# Patient Record
Sex: Male | Born: 1980 | Race: Asian | Hispanic: No | Marital: Single | Smoking: Never smoker
Health system: Southern US, Community
[De-identification: ages and names within clinical notes are randomized; demographics above are authoritative.]

## PROBLEM LIST (undated history)

## (undated) DIAGNOSIS — E079 Disorder of thyroid, unspecified: Secondary | ICD-10-CM

## (undated) HISTORY — DX: Disorder of thyroid, unspecified: E07.9

---

## 2006-09-23 ENCOUNTER — Ambulatory Visit: Payer: Self-pay | Admitting: Internal Medicine

## 2006-09-23 LAB — CONVERTED CEMR LAB: TSH: 6.75 microintl units/mL — ABNORMAL HIGH (ref 0.35–5.50)

## 2007-08-06 ENCOUNTER — Ambulatory Visit: Payer: Self-pay | Admitting: Internal Medicine

## 2007-08-06 DIAGNOSIS — E039 Hypothyroidism, unspecified: Secondary | ICD-10-CM | POA: Insufficient documentation

## 2007-08-09 LAB — CONVERTED CEMR LAB: TSH: 1.289 microintl units/mL (ref 0.350–5.50)

## 2007-09-09 ENCOUNTER — Ambulatory Visit: Payer: Self-pay | Admitting: Internal Medicine

## 2007-09-23 ENCOUNTER — Ambulatory Visit: Payer: Self-pay | Admitting: Internal Medicine

## 2008-06-29 ENCOUNTER — Telehealth: Payer: Self-pay | Admitting: Internal Medicine

## 2009-09-18 ENCOUNTER — Ambulatory Visit: Payer: Self-pay | Admitting: Internal Medicine

## 2009-09-20 LAB — CONVERTED CEMR LAB
ALT: 41 units/L (ref 0–53)
Albumin: 4.6 g/dL (ref 3.5–5.2)
Alkaline Phosphatase: 62 units/L (ref 39–117)
Basophils Relative: 0.2 % (ref 0.0–3.0)
Calcium: 9.3 mg/dL (ref 8.4–10.5)
Chloride: 102 meq/L (ref 96–112)
Eosinophils Absolute: 0 10*3/uL (ref 0.0–0.7)
Hemoglobin: 16.9 g/dL (ref 13.0–17.0)
Lymphocytes Relative: 34.8 % (ref 12.0–46.0)
MCHC: 32.6 g/dL (ref 30.0–36.0)
MCV: 89.2 fL (ref 78.0–100.0)
Neutro Abs: 2.7 10*3/uL (ref 1.4–7.7)
Phosphorus: 3.1 mg/dL (ref 2.3–4.6)
Potassium: 4 meq/L (ref 3.5–5.1)
RBC: 5.8 M/uL (ref 4.22–5.81)
TSH: 20.05 microintl units/mL — ABNORMAL HIGH (ref 0.35–5.50)
Total Protein: 7.6 g/dL (ref 6.0–8.3)

## 2009-10-03 ENCOUNTER — Ambulatory Visit: Payer: Self-pay | Admitting: Internal Medicine

## 2009-12-03 ENCOUNTER — Ambulatory Visit: Payer: Self-pay | Admitting: Internal Medicine

## 2009-12-03 DIAGNOSIS — F09 Unspecified mental disorder due to known physiological condition: Secondary | ICD-10-CM | POA: Insufficient documentation

## 2009-12-03 DIAGNOSIS — L820 Inflamed seborrheic keratosis: Secondary | ICD-10-CM | POA: Insufficient documentation

## 2009-12-05 LAB — CONVERTED CEMR LAB: Free T4: 1.2 ng/dL (ref 0.6–1.6)

## 2010-01-02 ENCOUNTER — Telehealth: Payer: Self-pay | Admitting: Internal Medicine

## 2010-01-02 ENCOUNTER — Encounter: Payer: Self-pay | Admitting: Internal Medicine

## 2010-01-07 ENCOUNTER — Ambulatory Visit: Payer: Self-pay | Admitting: Internal Medicine

## 2010-09-17 NOTE — Assessment & Plan Note (Signed)
Summary: Follow up with Dad here   Vital Signs:  Patient profile:   30 year old male Weight:      192 pounds Temp:     98.4 degrees F oral Pulse rate:   80 / minute Pulse rhythm:   regular BP sitting:   108 / 80  (left arm) Cuff size:   large  Vitals Entered By: Mervin Hack CMA Duncan Dull) (December 03, 2009 3:27 PM) CC: adult physical   History of Present Illness: In with dad  Has been taking the levothyroxine daily now feels okay  weight is down 6# has cut back some on eating Stays active at work but not really exercising regularly  Dad has spoken to others about his financial irresponsibility Born in [place] 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  Past Medical History: Reviewed history from 08/06/2007 and no changes required. Hypothyroidism  Past Surgical History: Denies surgical history  Family History: Reviewed history from 08/06/2007 and no changes required. Dad has HTN Mom with NIDDM 1 brother  2 sisters Dennie Bible GF died of lung cancer No CAD DM in [place] also  Social History: Reviewed history from 09/18/2009 and no changes required. 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  NAD Psych:  not anxious appearing and not depressed appearing.  Poor attention to visit. 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 Take 1 by mouth once daily  Orders: Venipuncture (59563) TLB-TSH (Thyroid Stimulating Hormone) (84443-TSH) TLB-T4 (Thyrox), Free [phone])  Complete Medication List: 1)  Synthroid 175 Mcg Tabs (Levothyroxine sodium) .... Take 1 by mouth once daily  Other Orders: Tdap => 21yrs IM (51884) Admin 1st Vaccine (16606) Admin 1st Vaccine Horticulturist, commercial) [phone])  Patient Instructions: 1)  Please set up the appt for psychological testing and then follow up appt with me several weeks after  Current Allergies (reviewed today): No known allergies    Tetanus/Td Vaccine    Vaccine Type: Tdap    Site: left deltoid    Mfr: GlaxoSmithKline    Dose: 0.5 ml    Route: IM  Given by: Mervin Hack CMA (AAMA)    Exp. Date: 11/10/2011    Lot #: [id number]    VIS given: 07/06/07 version given December 03, 2009.   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 Take 1 by mouth once daily

## 2010-09-17 NOTE — Assessment & Plan Note (Signed)
Summary: CPX/DLO   Vital Signs:  Patient profile:   30 year old male Height:      70.5 inches Weight:      198 pounds BMI:     28.11 Temp:     98.1 degrees F oral Pulse rate:   76 / minute Pulse rhythm:   regular BP sitting:   110 / 80  (left arm) Cuff size:   large  Vitals Entered By: Mervin Hack CMA Duncan Dull) (September 18, 2009 8:38 AM) CC: adult physical   History of Present Illness: Heel problems resolved on its own  Notes his weight gain discussed fitness and proper eating  Still on thyroid medicine  Allergies: No Known Drug Allergies  Past History:  Past medical, surgical, family and social histories (including risk factors) reviewed for relevance to current acute and chronic problems.  Past Medical History: Reviewed history from 08/06/2007 and no changes required. Hypothyroidism  Family History: Reviewed history from 08/06/2007 and no changes required. 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 Eyes:  Denies double vision and vision loss-1 eye. ENT:  Complains of ringing in ears; denies decreased hearing; rare tinnitus Occ dry throat feeling Sees dentist. CV:  Complains of shortness of breath with exertion; denies chest pain or discomfort, difficulty breathing at night, difficulty breathing while lying down, fainting, lightheadness, and palpitations; knows he is out of shape. Resp:  Denies cough and shortness of breath. GI:  Complains of indigestion; denies abdominal pain, bloody stools, change in bowel habits, dark tarry stools, nausea, and vomiting; occ reflux--generally doesn't use meds. GU:  Denies erectile dysfunction, urinary frequency, and urinary hesitancy; reviewed safe sex  practices. MS:  Denies joint pain and joint swelling. Derm:  Denies lesion(s) and rash. Neuro:  Complains of headaches; denies numbness, tingling, and weakness; rare headaches--relates to sinuses. Psych:  Complains of anxiety; denies depression; occ anxiety when he is tired. Heme:  Denies abnormal bruising and enlarge lymph nodes. Allergy:  Complains of seasonal allergies and sneezing; some congestion in AM Doesn't usually use meds--drinks green tea .  Physical Exam  General:  alert and normal appearance.   Eyes:  pupils equal, pupils round, pupils reactive to light, and no optic disk abnormalities.   Ears:  R ear normal and L ear normal.   Mouth:  no erythema and no lesions.   Neck:  supple, no masses, no thyromegaly, no carotid bruits, and no cervical lymphadenopathy.   Lungs:  normal respiratory effort and normal breath sounds.   Heart:  normal rate, no murmur, and no gallop.   Abdomen:  soft and non-tender.   Genitalia:  no scrotal masses and no testicular masses or atrophy.   Msk:  no joint tenderness and no joint swelling.   Pulses:  normal in feet Extremities:  no edema Neurologic:  alert & oriented X3, strength normal in all extremities, and gait normal.   Skin:  no rashes and no suspicious lesions.   Axillary Nodes:  No palpable lymphadenopathy Psych:  normally interactive, good eye contact, not anxious appearing, and not depressed appearing.  Labile personality--laughs, not always appropriate   Impression & Recommendations:  Problem # 1:  PREVENTIVE HEALTH CARE (  ICD-V70.0) Assessment Comment Only healthy counselled  Problem # 2:  HYPOTHYROIDISM (ICD-244.9) Assessment: Unchanged  due for labs  His updated medication list for this problem includes:    Synthroid 150 Mcg Tabs (Levothyroxine sodium) .Marland Kitchen... 1 daily by mouth  Labs Reviewed: TSH: 34.43 (09/23/2007)     Orders: TLB-Renal Function Panel (80069-RENAL) TLB-CBC Platelet - w/Differential  (85025-CBCD) TLB-Hepatic/Liver Function Pnl (80076-HEPATIC) TLB-TSH (Thyroid Stimulating Hormone) (84443-TSH) Venipuncture (16109) TLB-T4 (Thyrox), Free [phone])  Complete Medication List: 1)  Synthroid 150 Mcg Tabs (Levothyroxine sodium) .Marland Kitchen.. 1 daily by mouth  Patient Instructions: 1)  Please schedule a follow-up appointment in 1 year.   Current Allergies (reviewed today): No known allergies

## 2010-09-17 NOTE — Miscellaneous (Signed)
Summary: Vaccine Records  Vaccine Records   Imported By: Lanelle Bal 01/11/2010 09:57:40  _____________________________________________________________________  External Attachment:    Type:   Image     Comment:   External Document

## 2010-09-17 NOTE — Progress Notes (Signed)
Summary: Fill out forms  Phone Note Call from Patient Call back at [phone]   Caller: Dad, Jackson Ayers Call For: Cindee Salt MD Summary of Call: pt's father has dropped off college forms to be filled out and a copy of his immunization record. I called dad because some of the dates were not correct I thought, per dad in [place] the date is written different, backwards. I have entered into EMR and printed a copy. I will also scan in the record that he bought in. pt's last CPX was 09/18/2009. Please advise. Initial call taken by: Mervin Hack CMA Duncan Dull),  Jan 02, 2010 12:59 PM  Follow-up for Phone Call        He needs another MMR to be admitted  I also recommend a menactra when he comes in for the MMR I did the PE form but will wait on the imms form till he gets updates Follow-up by: Cindee Salt MD,  Jan 02, 2010 1:36 PM  Additional Follow-up for Phone Call Additional follow up Details #1::        spoke with father and he will bring son in for immunizations. Additional Follow-up by: Mervin Hack CMA Duncan Dull),  Jan 02, 2010 5:23 PM

## 2010-09-17 NOTE — Miscellaneous (Signed)
Summary: Immunization updated  Clinical Lists Changes  Observations: Added new observation of HEPBVAX#3: Historical (05/15/1988 12:54) Added new observation of OPV #3: Historical (11/26/1987 10:27) Added new observation of DPT #3: Historical (11/26/1987 10:27) Added new observation of OPV #2: Historical (11/04/1987 10:27) Added new observation of DPT #2: Historical (11/04/1987 10:27) Added new observation of HEPBVAX#2: Historical (11/01/1987 12:54) Added new observation of OPV #1: Historical (09/30/1987 10:27) Added new observation of DPT #1: Historical (09/30/1987 10:27) Added new observation of HEPBVAX#1: Historical (09/27/1987 12:54) Added new observation of MMR #1: Historical (10/24/1983 12:55)      Hepatitis B Immunization History:    Hep B # 1:  Historical (09/27/1987)    Hep B # 2:  Historical (11/01/1987)    Hep B # 3:  Historical (05/15/1988)  DPT Immunization History:    DPT # 1:  Historical (09/30/1987)    DPT # 2:  Historical (11/04/1987)    DPT # 3:  Historical (11/26/1987)  Polio Immunization History:    Polio # 1:  Historical (09/30/1987)    Polio # 2:  Historical (11/04/1987)    Polio # 3:  Historical (11/26/1987)  MMR Immunization History:    MMR # 1:  Historical (10/24/1983)

## 2010-09-17 NOTE — Assessment & Plan Note (Signed)
Summary: [employer]  Nurse Visit   Allergies: No Known Drug Allergies  Immunizations Administered:  MMR Vaccine # 2:    Vaccine Type: MMR    Site: left deltoid    Mfr: Sanofi Pasteur    Dose: 0.5 ml    Route: [place]    Given by: Mervin Hack CMA (AAMA)    Exp. Date: 05/16/2011    Lot #: [id number]    VIS given: 10/29/06 version given Jan 07, 2010.  Meningococcal Vaccine:    Vaccine Type: Meningococcal    Site: right deltoid    Mfr: Sanofi Pasteur    Dose: 0.5 ml    Route: IM    Given by: Mervin Hack CMA (AAMA)    Exp. Date: 05/30/2011    Lot #: [id number]    VIS given: 09/14/06 version given Jan 07, 2010.  Orders Added: 1)  MMR Vaccine SQ [90707] 2)  Admin 1st Vaccine [90471] 3)  Meningococcal Vaccine [place] [45409] 4)  Admin of Any Addtl Vaccine [81191]

## 2010-09-17 NOTE — Letter (Signed)
Summary: Medical History Form/[facility] [employer]  Medical History Form/[facility] [employer]   Imported By: Lanelle Bal 01/11/2010 09:54:12  _____________________________________________________________________  External Attachment:    Type:   Image     Comment:   External Document

## 2010-11-19 ENCOUNTER — Other Ambulatory Visit: Payer: Self-pay | Admitting: *Deleted

## 2010-11-19 MED ORDER — LEVOTHYROXINE SODIUM 175 MCG PO TABS: ORAL_TABLET | ORAL | Status: DC

## 2011-02-20 ENCOUNTER — Other Ambulatory Visit: Payer: Self-pay | Admitting: *Deleted

## 2011-02-20 MED ORDER — LEVOTHYROXINE SODIUM 175 MCG PO TABS: ORAL_TABLET | ORAL | Status: DC

## 2011-02-20 NOTE — Telephone Encounter (Signed)
rx sent to pharmacy by e-script  

## 2011-09-22 ENCOUNTER — Encounter: Payer: Self-pay | Admitting: Internal Medicine

## 2011-09-23 ENCOUNTER — Ambulatory Visit (INDEPENDENT_AMBULATORY_CARE_PROVIDER_SITE_OTHER): Payer: PRIVATE HEALTH INSURANCE | Admitting: Internal Medicine

## 2011-09-23 ENCOUNTER — Encounter: Payer: Self-pay | Admitting: Internal Medicine

## 2011-09-23 VITALS — BP 118/80 | HR 74 | Temp 98.6°F | Ht 70.0 in | Wt 218.0 lb

## 2011-09-23 DIAGNOSIS — E039 Hypothyroidism, unspecified: Secondary | ICD-10-CM

## 2011-09-23 LAB — CBC WITH DIFFERENTIAL/PLATELET
Basophils Absolute: 0 10*3/uL (ref 0.0–0.1)
Eosinophils Absolute: 0.1 10*3/uL (ref 0.0–0.7)
HCT: 49 % (ref 39.0–52.0)
Lymphs Abs: 2 10*3/uL (ref 0.7–4.0)
MCHC: 33.8 g/dL (ref 30.0–36.0)
MCV: 85.5 fl (ref 78.0–100.0)
Monocytes Absolute: 0.5 10*3/uL (ref 0.1–1.0)
Platelets: 253 10*3/uL (ref 150.0–400.0)
RDW: 13.7 % (ref 11.5–14.6)

## 2011-09-23 NOTE — Assessment & Plan Note (Signed)
Seems to be euthyroid Will check labs Discussed weight---needs to be more careful and exercise

## 2011-09-23 NOTE — Progress Notes (Signed)
  Subjective:    Patient ID: Jackson Ayers, male    DOB: [birth year], 31 y.o.   MRN: [id number]  HPI Generally doing okay Now student at [employer] Notes some fatigue Occ lightheadedness if he laughs  Still taking the thyroid No exercise Does drink fruit juice Not much fast food  No chest pain No SOB but does awaken with congestion in AM---clears fairly quickly Current Outpatient Prescriptions on File Prior to Visit  Medication Sig Dispense Refill  . levothyroxine (SYNTHROID, LEVOTHROID) 175 MCG tablet Take one by mouth daily  30 tablet  6    No Known Allergies  Past Medical History  Diagnosis Date  . Thyroid disease     No past surgical history on file.  Family History  Problem Relation Age of Onset  . Diabetes Mother   . Hypertension Father     History   Social History  . Marital Status: Single    Spouse Name: N/A    Number of Children: N/A  . Years of Education: N/A   Occupational History  . Not on file.   Social History Main Topics  . Smoking status: Never Smoker   . Smokeless tobacco: Never Used  . Alcohol Use: No  . Drug Use: No  . Sexually Active: Not on file   Other Topics Concern  . 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 No distress.  Neck: Normal range of motion. Neck supple. No thyromegaly present.  Cardiovascular: Normal rate, regular rhythm and normal heart sounds.  Exam reveals no gallop.   No murmur heard. Pulmonary/Chest: Effort normal and breath sounds normal. No respiratory distress. He has no wheezes. He has no rales.  Musculoskeletal: He exhibits no edema and no tenderness.  Lymphadenopathy:    He has no cervical adenopathy.  Psychiatric: He  has a normal mood and affect. His behavior is normal. Judgment and thought content normal.          Assessment & Plan:

## 2011-09-24 LAB — LIPID PANEL
Cholesterol: 196 mg/dL (ref 0–200)
HDL: 43.2 mg/dL (ref >39.00)
Triglycerides: 87 mg/dL (ref 0.0–149.0)
VLDL: 17.4 mg/dL (ref 0.0–40.0)

## 2011-09-24 LAB — HEPATIC FUNCTION PANEL
AST: 27 U/L (ref 0–37)
Albumin: 4.4 g/dL (ref 3.5–5.2)

## 2011-09-24 LAB — BASIC METABOLIC PANEL
BUN: 9 mg/dL (ref 6–23)
Chloride: 106 mEq/L (ref 96–112)
Creatinine, Ser: 0.9 mg/dL (ref 0.4–1.5)
Glucose, Bld: 87 mg/dL (ref 70–99)
Potassium: 4.7 mEq/L (ref 3.5–5.1)

## 2011-09-24 LAB — TSH: TSH: 7.32 u[IU]/mL — ABNORMAL HIGH (ref 0.35–5.50)

## 2011-12-29 ENCOUNTER — Other Ambulatory Visit: Payer: Self-pay | Admitting: Internal Medicine

## 2012-09-15 ENCOUNTER — Telehealth: Payer: Self-pay | Admitting: Internal Medicine

## 2012-09-15 NOTE — Telephone Encounter (Signed)
We can wait till May to recheck his labs

## 2012-09-15 NOTE — Telephone Encounter (Signed)
Spoke with patient and advised results   

## 2012-09-15 NOTE — Telephone Encounter (Signed)
Patient had a cpx scheduled on 09/30/12.  Patient can't come to appointments on Tuesdays and Thursdays.  He called back to reschedule appointment and the first available is 01/15/12.  Patient wants to know if you need to check his thyroid before May.

## 2012-09-30 ENCOUNTER — Encounter: Payer: PRIVATE HEALTH INSURANCE | Admitting: Internal Medicine

## 2013-01-14 ENCOUNTER — Encounter: Payer: PRIVATE HEALTH INSURANCE | Admitting: Internal Medicine

## 2013-02-16 ENCOUNTER — Other Ambulatory Visit: Payer: Self-pay | Admitting: Internal Medicine

## 2013-04-15 ENCOUNTER — Other Ambulatory Visit: Payer: Self-pay | Admitting: Internal Medicine

## 2013-04-20 ENCOUNTER — Encounter: Payer: PRIVATE HEALTH INSURANCE | Admitting: Internal Medicine

## 2013-04-20 DIAGNOSIS — Z0289 Encounter for other administrative examinations: Secondary | ICD-10-CM

## 2013-04-29 ENCOUNTER — Encounter: Payer: Self-pay | Admitting: Internal Medicine

## 2013-04-29 ENCOUNTER — Ambulatory Visit (INDEPENDENT_AMBULATORY_CARE_PROVIDER_SITE_OTHER): Payer: PRIVATE HEALTH INSURANCE | Admitting: Internal Medicine

## 2013-04-29 VITALS — BP 110/60 | HR 81 | Temp 97.9°F | Wt 213.0 lb

## 2013-04-29 DIAGNOSIS — E039 Hypothyroidism, unspecified: Secondary | ICD-10-CM

## 2013-04-29 DIAGNOSIS — K219 Gastro-esophageal reflux disease without esophagitis: Secondary | ICD-10-CM | POA: Insufficient documentation

## 2013-04-29 DIAGNOSIS — J019 Acute sinusitis, unspecified: Secondary | ICD-10-CM | POA: Insufficient documentation

## 2013-04-29 LAB — TSH: TSH: 14.88 u[IU]/mL — ABNORMAL HIGH (ref 0.35–5.50)

## 2013-04-29 MED ORDER — AMOXICILLIN 500 MG PO TABS: 1000.0000 mg | ORAL_TABLET | Freq: Two times a day (BID) | ORAL | Status: DC

## 2013-04-29 MED ORDER — BENZONATATE 200 MG PO CAPS: 200.0000 mg | ORAL_CAPSULE | Freq: Three times a day (TID) | ORAL | Status: DC | PRN

## 2013-04-29 MED ORDER — LEVOTHYROXINE SODIUM 175 MCG PO TABS: 175.0000 ug | ORAL_TABLET | Freq: Every day | ORAL | Status: DC

## 2013-04-29 NOTE — Assessment & Plan Note (Signed)
Mild symptoms Discussed not eating close to bedtime, H2 blocker prn

## 2013-04-29 NOTE — Assessment & Plan Note (Signed)
Symptoms are somewhat suggestive of allergy (itching) but no history of hay fever Will treat with amoxil and cough med Trial of antihistamine

## 2013-04-29 NOTE — Patient Instructions (Signed)
If the itching in your throat continues, you can try cetirizine 10mg  or fexofenadine 180mg  daily for this.  If the acid symptoms persist, you can try ranitidine 150mg  twice a day to control it. Don't lie down for at least 2 hours after eating.

## 2013-04-29 NOTE — Progress Notes (Signed)
  Subjective:    Patient ID: Jackson Ayers, male    DOB: 09-21-1980, 32 y.o.   MRN: [id number]  HPI Here with Dad  Having AM congestion--loosens up with hot tea Sore throat also--after cough Started 10 days or so  No fever Itchy in throat but doesn't feel sick Doesn't remember hay fever  No SOB No sputum  Still on thyroid exercise No fatigue Bowels are okay No regular exercise  Some water brash with cough Occasional heartburn if lying flat  Current Outpatient Prescriptions on File Prior to Visit  Medication Sig Dispense Refill  . levothyroxine (SYNTHROID, LEVOTHROID) 175 MCG tablet TAKE ONE TABLET BY MOUTH EVERY DAY.  30 tablet  0   No current facility-administered medications on file prior to visit.    No Known Allergies  Past Medical History  Diagnosis Date  . Thyroid disease     No past surgical history on file.  Family History  Problem Relation Age of Onset  . Diabetes Mother   . Hypertension Father     History   Social History  . Marital Status: Single    Spouse Name: N/A    Number of Children: N/A  . Years of Education: N/A   Occupational History  . Not on file.   Social History Main Topics  . Smoking status: Never Smoker   . Smokeless tobacco: Never Used  . Alcohol Use: No  . Drug Use: No  . Sexual Activity: Not on file   Other Topics Concern  . Not on file   Social History Narrative   Now a Consulting civil engineer at [employer]   Review of Systems No rash No vomiting or diarrhea---but can gag up stuff with cough    Objective:   Physical Exam  Constitutional: He appears well-developed and well-nourished. No distress.  Coarse cough  HENT:  Mouth/Throat: Oropharynx is clear and moist. No oropharyngeal exudate.  No sinus tenderness but left maxillary pain with nasal exam Marked nasal inflammation TMs normal  Neck: Normal range of motion. Neck supple. No thyromegaly present.  Cardiovascular: Normal rate, regular rhythm and  normal heart sounds.  Exam reveals no gallop.   No murmur heard. Pulmonary/Chest: Effort normal and breath sounds normal. No respiratory distress. He has no wheezes. He has no rales.  Musculoskeletal: He exhibits no edema.  Lymphadenopathy:    He has no cervical adenopathy.  Psychiatric: He has a normal mood and affect. His behavior is normal.          Assessment & Plan:

## 2013-04-29 NOTE — Assessment & Plan Note (Signed)
Seems euthyroid Due for labs 

## 2013-05-03 ENCOUNTER — Encounter: Payer: Self-pay | Admitting: *Deleted

## 2014-05-04 ENCOUNTER — Encounter: Payer: PRIVATE HEALTH INSURANCE | Admitting: Internal Medicine

## 2014-05-04 DIAGNOSIS — Z0289 Encounter for other administrative examinations: Secondary | ICD-10-CM

## 2014-05-31 ENCOUNTER — Other Ambulatory Visit: Payer: Self-pay | Admitting: Internal Medicine

## 2014-05-31 NOTE — Telephone Encounter (Signed)
Last office visit 04/29/2013.  Last TSH 04/29/2013.  Ok to refill?

## 2014-06-01 ENCOUNTER — Other Ambulatory Visit: Payer: Self-pay

## 2014-06-01 NOTE — Telephone Encounter (Signed)
Okay to fill #90 x 0 Needs to reschedule the physical

## 2014-09-21 ENCOUNTER — Other Ambulatory Visit: Payer: Self-pay | Admitting: Internal Medicine

## 2014-09-27 ENCOUNTER — Encounter: Payer: Self-pay | Admitting: Internal Medicine

## 2014-09-27 ENCOUNTER — Ambulatory Visit (INDEPENDENT_AMBULATORY_CARE_PROVIDER_SITE_OTHER): Payer: BLUE CROSS/BLUE SHIELD | Admitting: Internal Medicine

## 2014-09-27 VITALS — BP 112/80 | HR 79 | Temp 97.9°F | Wt 211.0 lb

## 2014-09-27 DIAGNOSIS — R059 Cough, unspecified: Secondary | ICD-10-CM | POA: Insufficient documentation

## 2014-09-27 DIAGNOSIS — F39 Unspecified mood [affective] disorder: Secondary | ICD-10-CM | POA: Insufficient documentation

## 2014-09-27 DIAGNOSIS — R41840 Attention and concentration deficit: Secondary | ICD-10-CM

## 2014-09-27 DIAGNOSIS — R05 Cough: Secondary | ICD-10-CM

## 2014-09-27 DIAGNOSIS — E039 Hypothyroidism, unspecified: Secondary | ICD-10-CM

## 2014-09-27 LAB — COMPREHENSIVE METABOLIC PANEL
ALT: 52 U/L (ref 0–53)
AST: 24 U/L (ref 0–37)
Albumin: 4.6 g/dL (ref 3.5–5.2)
Alkaline Phosphatase: 84 U/L (ref 39–117)
BILIRUBIN TOTAL: 0.7 mg/dL (ref 0.2–1.2)
BUN: 15 mg/dL (ref 6–23)
CALCIUM: 9.9 mg/dL (ref 8.4–10.5)
CHLORIDE: 104 meq/L (ref 96–112)
CO2: 28 mEq/L (ref 19–32)
CREATININE: 0.9 mg/dL (ref 0.40–1.50)
GFR: 103.82 mL/min (ref >60.00)
Glucose, Bld: 96 mg/dL (ref 70–99)
Potassium: 4.2 mEq/L (ref 3.5–5.1)
Sodium: 137 mEq/L (ref 135–145)
Total Protein: 7.5 g/dL (ref 6.0–8.3)

## 2014-09-27 LAB — CBC WITH DIFFERENTIAL/PLATELET
Basophils Absolute: 0 10*3/uL (ref 0.0–0.1)
Basophils Relative: 0.6 % (ref 0.0–3.0)
EOS PCT: 1.3 % (ref 0.0–5.0)
Eosinophils Absolute: 0.1 10*3/uL (ref 0.0–0.7)
HCT: 49.8 % (ref 39.0–52.0)
Hemoglobin: 16.8 g/dL (ref 13.0–17.0)
LYMPHS PCT: 33.3 % (ref 12.0–46.0)
Lymphs Abs: 2.3 10*3/uL (ref 0.7–4.0)
MCHC: 33.8 g/dL (ref 30.0–36.0)
MCV: 83.1 fl (ref 78.0–100.0)
MONOS PCT: 8.1 % (ref 3.0–12.0)
Monocytes Absolute: 0.5 10*3/uL (ref 0.1–1.0)
NEUTROS PCT: 56.7 % (ref 43.0–77.0)
Neutro Abs: 3.8 10*3/uL (ref 1.4–7.7)
PLATELETS: 267 10*3/uL (ref 150.0–400.0)
RBC: 5.99 Mil/uL — ABNORMAL HIGH (ref 4.22–5.81)
RDW: 14.1 % (ref 11.5–15.5)
WBC: 6.8 10*3/uL (ref 4.0–10.5)

## 2014-09-27 LAB — TSH: TSH: 9.63 u[IU]/mL — ABNORMAL HIGH (ref 0.35–4.50)

## 2014-09-27 LAB — T4, FREE: Free T4: 1.25 ng/dL (ref 0.60–1.60)

## 2014-09-27 MED ORDER — LEVOTHYROXINE SODIUM 175 MCG PO TABS: ORAL_TABLET | ORAL | Status: DC

## 2014-09-27 NOTE — Patient Instructions (Signed)
Please try nasacort or flonase (available over the counter)---- 2 sprays in each nostril twice a day for the first week, then once a day (for the nasal congestion). If things ease up after the winter, you can try stopping it and then restart for next winter.

## 2014-09-27 NOTE — Progress Notes (Signed)
   Subjective:    Patient ID: Jackson Ayers, male    DOB: 10/27/1980, 34 y.o.   MRN: [id number]  HPI Here for follow up of thyroid and other issues  Is remembering the thyroid medication Active at work--- likes this more Energy levels are okay Weight is stable No apparent change in hair, nails or skin  Still has troubles with focusing Interested in referral for further evaluation  Having a cough when he is flat in bed Feels post nasal drip No history of allergies---notices it in winter Washes pillow regularly ?gas heat and fireplace Hasn't tried any medication--will go outside and improves Cough produces some mucus No fever Not really sick---just lightheaded with congestion Some help with nasal spray  Current Outpatient Prescriptions on File Prior to Visit  Medication Sig Dispense Refill  . levothyroxine (SYNTHROID, LEVOTHROID) 175 MCG tablet TAKE ONE TABLET BY MOUTH ONCE DAILY BEFORE BREAKFAST 90 tablet 0   No current facility-administered medications on file prior to visit.    No Known Allergies  Past Medical History  Diagnosis Date  . Thyroid disease     No past surgical history on file.  Family History  Problem Relation Age of Onset  . Diabetes Mother   . Hypertension Father     Review of Systems Occasional stress headaches Appetite is good--healthier eating now. Eats a regular lunch now Sleep may be interrupted due to the congestion Occasional nasal problems when eating also Some pain in knees lately--not as flexible    Objective:   Physical Exam  Constitutional: He appears well-developed and well-nourished. No distress.  HENT:  Mouth/Throat: Oropharynx is clear and moist. No oropharyngeal exudate.  Marked nasal inflammation --- left >right  Neck: Normal range of motion. Neck supple. No thyromegaly present.  Cardiovascular: Normal rate, regular rhythm and normal heart sounds.  Exam reveals no gallop.   No murmur heard. Pulmonary/Chest: Effort  normal and breath sounds normal.  Abdominal: Soft. Bowel sounds are normal. He exhibits no distension. There is no tenderness.  Musculoskeletal: He exhibits no edema or tenderness.  Knees are stable No crepitus Normal ROM  Lymphadenopathy:    He has no cervical adenopathy.  Psychiatric: He has a normal mood and affect. His behavior is normal.          Assessment & Plan:

## 2014-09-27 NOTE — Assessment & Plan Note (Signed)
Has had lifelong problems Trouble with staying with job, etc Will refer to Dr Lurline Hare for formal testing

## 2014-09-27 NOTE — Assessment & Plan Note (Signed)
Seems to be euthryoid Will recheck labs

## 2014-09-27 NOTE — Assessment & Plan Note (Signed)
Seems to be related to nasal congestion and drip Only in winter Discussed filters (which are changed) and humidifier Snores--may be from that (but no clear apnea) Recommended nasal steroids since he has sig inflammation

## 2014-09-28 ENCOUNTER — Encounter: Payer: Self-pay | Admitting: *Deleted

## 2014-10-18 ENCOUNTER — Ambulatory Visit (INDEPENDENT_AMBULATORY_CARE_PROVIDER_SITE_OTHER): Payer: BLUE CROSS/BLUE SHIELD | Admitting: Psychology

## 2014-10-18 DIAGNOSIS — F900 Attention-deficit hyperactivity disorder, predominantly inattentive type: Secondary | ICD-10-CM

## 2014-11-15 ENCOUNTER — Ambulatory Visit (INDEPENDENT_AMBULATORY_CARE_PROVIDER_SITE_OTHER): Payer: BLUE CROSS/BLUE SHIELD | Admitting: Psychology

## 2014-11-15 DIAGNOSIS — F4323 Adjustment disorder with mixed anxiety and depressed mood: Secondary | ICD-10-CM

## 2014-11-15 DIAGNOSIS — F900 Attention-deficit hyperactivity disorder, predominantly inattentive type: Secondary | ICD-10-CM

## 2014-11-28 ENCOUNTER — Telehealth: Payer: Self-pay

## 2014-11-28 NOTE — Telephone Encounter (Signed)
There are no apparent interactions with synthroid and either of the other medications

## 2014-11-28 NOTE — Telephone Encounter (Signed)
Pt left v/m requesting cb about any interactions or problems taking synthroid and vyvanse or evekeo.Please advise.

## 2014-11-29 NOTE — Telephone Encounter (Signed)
Left message on machine with results, advised pt to call if any questions

## 2014-12-13 ENCOUNTER — Ambulatory Visit (INDEPENDENT_AMBULATORY_CARE_PROVIDER_SITE_OTHER): Payer: BLUE CROSS/BLUE SHIELD | Admitting: Psychology

## 2014-12-13 DIAGNOSIS — F900 Attention-deficit hyperactivity disorder, predominantly inattentive type: Secondary | ICD-10-CM

## 2015-01-10 ENCOUNTER — Ambulatory Visit (INDEPENDENT_AMBULATORY_CARE_PROVIDER_SITE_OTHER): Payer: BLUE CROSS/BLUE SHIELD | Admitting: Psychology

## 2015-01-10 DIAGNOSIS — F319 Bipolar disorder, unspecified: Secondary | ICD-10-CM

## 2015-01-10 DIAGNOSIS — F900 Attention-deficit hyperactivity disorder, predominantly inattentive type: Secondary | ICD-10-CM

## 2015-02-02 ENCOUNTER — Ambulatory Visit: Payer: BLUE CROSS/BLUE SHIELD | Admitting: Psychology

## 2015-02-14 ENCOUNTER — Ambulatory Visit (INDEPENDENT_AMBULATORY_CARE_PROVIDER_SITE_OTHER): Payer: BLUE CROSS/BLUE SHIELD | Admitting: Psychology

## 2015-02-14 DIAGNOSIS — F902 Attention-deficit hyperactivity disorder, combined type: Secondary | ICD-10-CM

## 2015-03-02 ENCOUNTER — Ambulatory Visit: Payer: BLUE CROSS/BLUE SHIELD | Admitting: Psychology

## 2015-09-13 ENCOUNTER — Ambulatory Visit: Payer: Worker's Compensation

## 2015-09-13 ENCOUNTER — Ambulatory Visit (INDEPENDENT_AMBULATORY_CARE_PROVIDER_SITE_OTHER): Payer: Worker's Compensation | Admitting: Family Medicine

## 2015-09-13 VITALS — BP 110/72 | HR 85 | Temp 98.3°F | Resp 20 | Ht 72.0 in | Wt 221.6 lb

## 2015-09-13 DIAGNOSIS — S6982XA Other specified injuries of left wrist, hand and finger(s), initial encounter: Secondary | ICD-10-CM | POA: Diagnosis not present

## 2015-09-13 DIAGNOSIS — S61012A Laceration without foreign body of left thumb without damage to nail, initial encounter: Secondary | ICD-10-CM

## 2015-09-13 MED ORDER — CEPHALEXIN 500 MG PO CAPS: 500.0000 mg | ORAL_CAPSULE | Freq: Two times a day (BID) | ORAL | Status: AC

## 2015-09-13 MED ORDER — CEPHALEXIN 500 MG PO CAPS: 500.0000 mg | ORAL_CAPSULE | Freq: Two times a day (BID) | ORAL | Status: DC

## 2015-09-13 NOTE — Progress Notes (Addendum)
Jackson Ayers [birth year] 35 y.o.   Chief Complaint  Patient presents with  . Other    left hand/ cut to thumb W/C     Date of Injury: 09/12/2015  History of Present Illness:  Presents for evaluation of work-related complaint.  Patient was using a "pepper cutter" when he sliced the top of his left thumb across his nail yesterday around 10:15 am.  Father and him placed his thumb in a soak of betadine.  They then wrapped the lacertion in bandaging.  He denies loss of sensation.  He denies any weakness of the finger.   His TDAP is utd.      ROS ROS otherwise unremarkable unless listed above.     No Known Allergies   Current medications reviewed and updated. Past medical history, family history, social history have been reviewed and updated.   Physical Exam  Constitutional: He is oriented to person, place, and time and well-developed, well-nourished, and in no distress. No distress.  Cardiovascular: Normal rate.   Pulmonary/Chest: Effort normal. No respiratory distress. He has no wheezes.  Neurological: He is alert and oriented to person, place, and time.  Skin: He is not diaphoretic.  Thumb with 3 cm rounded horizontal laceration across the nail.  There is tissue peaking from the laceration on the lateral end.  Tender to the distal lacerated nail bed.  Sensation intact.  Resisted strength with flexion and extension.  Normal capillary refill.    Psychiatric: Mood and affect normal.   Procedure: Verbal consent obtained. Alcohol swabbed at the digital block location prior to injection.  .5% marcaine/1%lidocaine at digital block position of the left thumb.  Anesthesia minimally obtained.  Cleansed with soap and water.  Localized injection given without complication.  Distal lacerated nail bed was removed with nail cutter and hemostats.  Laceration searched with depth siting past the germinal matrix.  No site of bone.  Patient was then brought to XR with fracture now questionable.   New surgical tray was placed.  Finger re-washed with soap and water.  New surgical gloves were donned.  5-0 vicryl placed at nailbed to the distal nail bed in simple interrupted sutures.  Xeroform placed at wound. Normal saline cleansed.  Dressings then applied.      Dg Finger Thumb Left  09/13/2015  CLINICAL DATA:  Patient was using a cutting tool at work, when he sliced the top of his left thumb, and into the nail. He denies loss of sensation. He has weakness. EXAM: LEFT THUMB 2+V COMPARISON:  No fracture foreign body. FINDINGS: There is bandage material over the distal digit. No fracture foreign body.Soft tissue injury to the nail bed noted. IMPRESSION: No fracture or foreign body.  Nail bed injury. Electronically Signed   By: Suzy Bouchard M.D.   On: 09/13/2015 21:32   Assessment and Plan: 35 year old male is here today with laceration of nail bed.  This is a deep wound with over 24 hours without wound care.   Hand surgery consult appreciated.  Will attempt to get this tomorrow.  Starting keflex 500mg  bid for 7 days.   Nailbed injury, left, initial encounter  Laceration of thumb with delay in treatment, left, initial encounter - Plan: DG Finger Thumb Left, cephALEXin (KEFLEX) 500 MG capsule, Ambulatory referral to Hand Surgery,   Ivar Drape, PA-C [facility] 1/26/201711:10 PM

## 2015-09-13 NOTE — Patient Instructions (Addendum)
Please await contact for your appointment with the hand surgeon. Please take antibiotic as prescribed. He can take tylenol or ibuprofen for the pain.   Keep the wound covered.   Laceration Care, Adult A laceration is a cut that goes through all of the layers of the skin and into the tissue that is right under the skin. Some lacerations heal on their own. Others need to be closed with stitches (sutures), staples, skin adhesive strips, or skin glue. Proper laceration care minimizes the risk of infection and helps the laceration to heal better. HOW TO CARE FOR YOUR LACERATION If sutures or staples were used:  Keep the wound clean and dry.  If you were given a bandage (dressing), you should change it at least one time per day or as told by your health care provider. You should also change it if it becomes wet or dirty.  Keep the wound completely dry for the first 24 hours or as told by your health care provider. After that time, you may shower or bathe. However, make sure that the wound is not soaked in water until after the sutures or staples have been removed.  Clean the wound one time each day or as told by your health care provider:  Wash the wound with soap and water.  Rinse the wound with water to remove all soap.  Pat the wound dry with a clean towel. Do not rub the wound.  After cleaning the wound, apply a thin layer of antibiotic ointmentas told by your health care provider. This will help to prevent infection and keep the dressing from sticking to the wound.  Have the sutures or staples removed as told by your health care provider. If skin adhesive strips were used:  Keep the wound clean and dry.  If you were given a bandage (dressing), you should change it at least one time per day or as told by your health care provider. You should also change it if it becomes dirty or wet.  Do not get the skin adhesive strips wet. You may shower or bathe, but be careful to keep the wound  dry.  If the wound gets wet, pat it dry with a clean towel. Do not rub the wound.  Skin adhesive strips fall off on their own. You may trim the strips as the wound heals. Do not remove skin adhesive strips that are still stuck to the wound. They will fall off in time. If skin glue was used:  Try to keep the wound dry, but you may briefly wet it in the shower or bath. Do not soak the wound in water, such as by swimming.  After you have showered or bathed, gently pat the wound dry with a clean towel. Do not rub the wound.  Do not do any activities that will make you sweat heavily until the skin glue has fallen off on its own.  Do not apply liquid, cream, or ointment medicine to the wound while the skin glue is in place. Using those may loosen the film before the wound has healed.  If you were given a bandage (dressing), you should change it at least one time per day or as told by your health care provider. You should also change it if it becomes dirty or wet.  If a dressing is placed over the wound, be careful not to apply tape directly over the skin glue. Doing that may cause the glue to be pulled off before the wound has  healed.  Do not pick at the glue. The skin glue usually remains in place for 5-10 days, then it falls off of the skin. General Instructions  Take over-the-counter and prescription medicines only as told by your health care provider.  If you were prescribed an antibiotic medicine or ointment, take or apply it as told by your doctor. Do not stop using it even if your condition improves.  To help prevent scarring, make sure to cover your wound with sunscreen whenever you are outside after stitches are removed, after adhesive strips are removed, or when glue remains in place and the wound is healed. Make sure to wear a sunscreen of at least 30 SPF.  Do not scratch or pick at the wound.  Keep all follow-up visits as told by your health care provider. This is  important.  Check your wound every day for signs of infection. Watch for:  Redness, swelling, or pain.  Fluid, blood, or pus.  Raise (elevate) the injured area above the level of your heart while you are sitting or lying down, if possible. SEEK MEDICAL CARE IF:  You received a tetanus shot and you have swelling, severe pain, redness, or bleeding at the injection site.  You have a fever.  A wound that was closed breaks open.  You notice a bad smell coming from your wound or your dressing.  You notice something coming out of the wound, such as wood or glass.  Your pain is not controlled with medicine.  You have increased redness, swelling, or pain at the site of your wound.  You have fluid, blood, or pus coming from your wound.  You notice a change in the color of your skin near your wound.  You need to change the dressing frequently due to fluid, blood, or pus draining from the wound.  You develop a new rash.  You develop numbness around the wound. SEEK IMMEDIATE MEDICAL CARE IF:  You develop severe swelling around the wound.  Your pain suddenly increases and is severe.  You develop painful lumps near the wound or on skin that is anywhere on your body.  You have a red streak going away from your wound.  The wound is on your hand or foot and you cannot properly move a finger or toe.  The wound is on your hand or foot and you notice that your fingers or toes look pale or bluish.   This information is not intended to replace advice given to you by your health care provider. Make sure you discuss any questions you have with your health care provider.   Document Released: 08/04/2005 Document Revised: 12/19/2014 Document Reviewed: 07/31/2014 Elsevier Interactive Patient Education [employer].

## 2015-09-13 NOTE — Progress Notes (Deleted)
[facility] [address] [place] [address] [phone]  Date:  09/13/2015   Name:  Jackson Ayers   DOB:  12-Nov-1980   MRN:  [id number]  PCP:  Viviana Simpler, MD    History of Present Illness:  Jackson Ayers is a 35 y.o. male patient who presents to [facility]     Patient Active Problem List   Diagnosis Date Noted  . Attention and concentration deficit 09/27/2014  . Cough 09/27/2014  . Acute sinusitis 04/29/2013  . GERD (gastroesophageal reflux disease) 04/29/2013  . Hypothyroidism 08/06/2007    Past Medical History  Diagnosis Date  . Thyroid disease     History reviewed. No pertinent past surgical history.  Social History  Substance Use Topics  . Smoking status: Never Smoker   . Smokeless tobacco: Never Used  . Alcohol Use: No    Family History  Problem Relation Age of Onset  . Diabetes Mother   . Hypertension Father     No Known Allergies  Medication list has been reviewed and updated.  Current Outpatient Prescriptions on File Prior to Visit  Medication Sig Dispense Refill  . levothyroxine (SYNTHROID, LEVOTHROID) 175 MCG tablet 1 tablet daily on empty stomach 90 tablet 3   No current facility-administered medications on file prior to visit.    ROS   Physical Examination: BP 110/72 mmHg  Pulse 85  Temp(Src) 98.3 F (36.8 C) (Oral)  Resp 20  Ht 6' (1.829 m)  Wt 221 lb 9.6 oz (100.517 kg)  BMI 30.05 kg/m2  SpO2 98% Ideal Body Weight: Weight in (lb) to have BMI = 25: 183.9  Physical Exam   Assessment and Plan: Jackson Ayers is a 35 y.o. male who is here today  There are no diagnoses linked to this encounter.  Jackson Drape, PA-C [facility] 09/13/2015 8:33 PM

## 2015-09-13 NOTE — Progress Notes (Addendum)
During visit of 09/13/15, patient's father a distraction to procedure.  Patient's father hovered over my shoulder throughout the cleansing and procedure, with various questions and advice.  For example--why do we need to take the nail off? Could we just put a bandage over this?  Can he have an extra pillow? Maybe you should not give him more medicine (lidocaine) because he may not be feeling any pain, and is just seeing the needle, can he have more blankets, etc.--this was fine, however noted.  I spent time advising that we did not know how far the laceration went at this time, and this would need to be evaluated.  The son, the patient, appeared to have no complaints with his treatment, patient's father became concerned with possible transmission of infection during procedure anesthesia when I did not see that CMA was not wearing a glove with drawing lidocaine.  Patient continued to not have a concern for his treatment and did not voice a displeasure.  He sought the attention of supervising physician at that time.    He was sent to [facility], while a fresh surgical tray was prepared.  Finger was then cleansed again with soap and water, and new surgical gloves were donned to proceed.  Patient's father repeated that he was concerned that this the mishap may cause an infection.  I advised patient's father that this was an open wound over 24 hours without treatment, so infection could be concerning.  We will do our due diligence by covering with keflex at this time.  At the end of visit, patient's father voiced "you did a good job" to me.

## 2015-09-14 NOTE — Progress Notes (Signed)
I was asked to inspect wound after it was cleaned for further management after nailbed was lacerated 24 hours prior to patient presented to our clinic, per dad he had soaked the finger in Betadine at home and wrapped it in gauze, the bleeding had stopped but they came 24 hours later to get the finger looked at.  After  I inspected the wound , dad pulled me aside and was worried about infection after he noticed Aram Beecham CMA had prepared the 2nd lidocaine syringe without gloves and then handed the syringe to [place] PA-C who was already working in the field. He states that The PA-C had touched the syringe and then touched the wound, she was however using sterile forceps and scissors from the laceration tray while I was inspecting the wound with her. Due to the dad's concerns, the wound was washed out again using a new surgical tray with sterile gloves/technique. Dad wanted to get the names of the the people who he needs to contact to lodge a formal complaint since he was worried about possible infection. He was given the business cards to our medical director Dr Reginia Forts, clinical nurse manage Rodney Langton and also the names of those who he interacted with including myself, Christain Sacramento DO, Colletta Maryland English PA-C, and also Aram Beecham CMA. Patient was put on Keflex and also will be referred to the hand center.

## 2015-09-16 ENCOUNTER — Encounter: Payer: Self-pay | Admitting: Family Medicine

## 2015-10-05 ENCOUNTER — Encounter: Payer: Self-pay | Admitting: Family Medicine

## 2015-10-05 ENCOUNTER — Ambulatory Visit (INDEPENDENT_AMBULATORY_CARE_PROVIDER_SITE_OTHER): Payer: BLUE CROSS/BLUE SHIELD | Admitting: Family Medicine

## 2015-10-05 ENCOUNTER — Telehealth: Payer: Self-pay | Admitting: Internal Medicine

## 2015-10-05 VITALS — BP 100/80 | HR 101 | Temp 97.8°F | Ht 72.0 in | Wt 215.7 lb

## 2015-10-05 DIAGNOSIS — B9789 Other viral agents as the cause of diseases classified elsewhere: Principal | ICD-10-CM

## 2015-10-05 DIAGNOSIS — J069 Acute upper respiratory infection, unspecified: Secondary | ICD-10-CM | POA: Diagnosis not present

## 2015-10-05 NOTE — Telephone Encounter (Signed)
TH scheduled appt 10/05/15 at 3:30 with Dr Elease Hashimoto.

## 2015-10-05 NOTE — Telephone Encounter (Signed)
Patient Name: Jackson Ayers  DOB: February 17, 1981    Initial Comment Caller states my son has a cold and headache.    Nurse Assessment  Nurse: Raphael Gibney, RN, Vanita Ingles Date/Time (Eastern Time): 10/05/2015 10:13:54 AM  Confirm and document reason for call. If symptomatic, describe symptoms. You must click the next button to save text entered. ---Caller states son has cold and headache. Not sure if he has a fever. Has had cold 2-3 days. has headache in the back of his head.  Has the patient traveled out of the country within the last 30 days? ---No  Does the patient have any new or worsening symptoms? ---Yes  Will a triage be completed? ---Yes  Related visit to physician within the last 2 weeks? ---No  Does the PT have any chronic conditions? (i.e. diabetes, asthma, etc.) ---Yes  List chronic conditions. ---hypothyroidism  Is this a behavioral health or substance abuse call? ---No     Guidelines    Guideline Title Affirmed Question Affirmed Notes  Common Cold [1] Nasal discharge AND [2] present > 10 days    Final Disposition User   See PCP When Office is Open (within 3 days) Raphael Gibney, RN, Vanita Ingles    Comments  he is coughing also. Father is bringing his wife to see Dr. Carolann Littler at 3:15 pm at [facility] on 10/05/15. He needs to bring his son to the doctor also. States that [facility] is too far away as his son is at his house and wants appt at [place] also. Appt scheduled for 10/05/15 at 3:30 pm with Dr. Carolann Littler at [place].  Caller states son has had cold for 2 months.   Referrals  REFERRED TO PCP OFFICE   Disagree/Comply: Comply

## 2015-10-05 NOTE — Patient Instructions (Signed)
Viral Infections °A viral infection can be caused by different types of viruses. Most viral infections are not serious and resolve on their own. However, some infections may cause severe symptoms and may lead to further complications. °SYMPTOMS °Viruses can frequently cause: °· Minor sore throat. °· Aches and pains. °· Headaches. °· Runny nose. °· Different types of rashes. °· Watery eyes. °· Tiredness. °· Cough. °· Loss of appetite. °· Gastrointestinal infections, resulting in nausea, vomiting, and diarrhea. °These symptoms do not respond to antibiotics because the infection is not caused by bacteria. However, you might catch a bacterial infection following the viral infection. This is sometimes called a "superinfection." Symptoms of such a bacterial infection may include: °· Worsening sore throat with pus and difficulty swallowing. °· Swollen neck glands. °· Chills and a high or persistent fever. °· Severe headache. °· Tenderness over the sinuses. °· Persistent overall ill feeling (malaise), muscle aches, and tiredness (fatigue). °· Persistent cough. °· Yellow, green, or brown mucus production with coughing. °HOME CARE INSTRUCTIONS  °· Only take over-the-counter or prescription medicines for pain, discomfort, diarrhea, or fever as directed by your caregiver. °· Drink enough water and fluids to keep your urine clear or pale yellow. Sports drinks can provide valuable electrolytes, sugars, and hydration. °· Get plenty of rest and maintain proper nutrition. Soups and broths with crackers or rice are fine. °SEEK IMMEDIATE MEDICAL CARE IF:  °· You have severe headaches, shortness of breath, chest pain, neck pain, or an unusual rash. °· You have uncontrolled vomiting, diarrhea, or you are unable to keep down fluids. °· You or your child has an oral temperature above 102° F (38.9° C), not controlled by medicine. °· Your baby is older than 3 months with a rectal temperature of 102° F (38.9° C) or higher. °· Your baby is 3  months old or younger with a rectal temperature of 100.4° F (38° C) or higher. °MAKE SURE YOU:  °· Understand these instructions. °· Will watch your condition. °· Will get help right away if you are not doing well or get worse. °  °This information is not intended to replace advice given to you by your health care provider. Make sure you discuss any questions you have with your health care provider. °  °Document Released: 05/14/2005 Document Revised: 10/27/2011 Document Reviewed: 01/10/2015 °Elsevier Interactive Patient Education ©2016 Elsevier Inc. ° °

## 2015-10-05 NOTE — Progress Notes (Signed)
   Subjective:    Patient ID: Jackson Ayers, male    DOB: [birth year], 35 y.o.   MRN: [id number]  HPI Acute visit. Patient seen with basically 1 day history of some sinus congestion and mild cough. He states he's had some similar symptoms but they come and go over the past couple months. Exposure to other family members with similar symptoms. Slightly yellow colored mucus. Cough is dry. No dyspnea. No fevers or chills. Denies any nausea, vomiting, or diarrhea.  Past Medical History  Diagnosis Date  . Thyroid disease    No past surgical history on file.  reports that he has never smoked. He has never used smokeless tobacco. He reports that he does not drink alcohol or use illicit drugs. family history includes Diabetes in his mother; Hypertension in his father. No Known Allergies    Review of Systems  Constitutional: Negative for fever and chills.  HENT: Positive for congestion. Negative for sore throat.   Respiratory: Positive for cough.        Objective:   Physical Exam  Constitutional: He appears well-developed and well-nourished. No distress.  HENT:  Right Ear: External ear normal.  Left Ear: External ear normal.  Mouth/Throat: Oropharynx is clear and moist.  Neck: Neck supple.  Cardiovascular: Normal rate and regular rhythm.   Pulmonary/Chest: Effort normal and breath sounds normal. No respiratory distress. He has no wheezes. He has no rales.  Lymphadenopathy:    He has no cervical adenopathy.          Assessment & Plan:  Probable viral URI with cough. Recommend symptomatic treatment with over-the-counter medications.

## 2015-10-05 NOTE — Progress Notes (Signed)
Pre visit review using our clinic review tool, if applicable. No additional management support is needed unless otherwise documented below in the visit note. 

## 2015-10-30 ENCOUNTER — Institutional Professional Consult (permissible substitution): Payer: Self-pay | Admitting: Pulmonary Disease

## 2015-12-19 ENCOUNTER — Other Ambulatory Visit: Payer: Self-pay | Admitting: Internal Medicine

## 2016-03-19 ENCOUNTER — Encounter: Payer: Self-pay | Admitting: Internal Medicine

## 2016-03-19 DIAGNOSIS — Z0289 Encounter for other administrative examinations: Secondary | ICD-10-CM

## 2016-03-28 ENCOUNTER — Encounter: Payer: Self-pay | Admitting: Internal Medicine

## 2016-05-08 ENCOUNTER — Other Ambulatory Visit: Payer: Self-pay

## 2016-05-08 MED ORDER — LEVOTHYROXINE SODIUM 175 MCG PO TABS: ORAL_TABLET | ORAL | 0 refills | Status: DC

## 2016-05-08 NOTE — Telephone Encounter (Signed)
Pt called saying he needed a refill of synthroid. He was made aware he needs an office visit. Jackson Ayers is helping him set that up now. I have sent in a refill

## 2016-05-14 ENCOUNTER — Ambulatory Visit (INDEPENDENT_AMBULATORY_CARE_PROVIDER_SITE_OTHER): Payer: BLUE CROSS/BLUE SHIELD | Admitting: Internal Medicine

## 2016-05-14 ENCOUNTER — Encounter: Payer: Self-pay | Admitting: Internal Medicine

## 2016-05-14 VITALS — BP 102/82 | HR 81 | Temp 97.6°F | Wt 216.0 lb

## 2016-05-14 DIAGNOSIS — E039 Hypothyroidism, unspecified: Secondary | ICD-10-CM | POA: Diagnosis not present

## 2016-05-14 DIAGNOSIS — J301 Allergic rhinitis due to pollen: Secondary | ICD-10-CM | POA: Insufficient documentation

## 2016-05-14 LAB — TSH: TSH: 23.99 u[IU]/mL — AB (ref 0.35–4.50)

## 2016-05-14 LAB — T4, FREE: Free T4: 0.44 ng/dL — ABNORMAL LOW (ref 0.60–1.60)

## 2016-05-14 NOTE — Assessment & Plan Note (Signed)
Seems euthyroid ?Will check labs ?

## 2016-05-14 NOTE — Assessment & Plan Note (Signed)
Discussed appropriate OTC meds

## 2016-05-14 NOTE — Patient Instructions (Addendum)
Please try fexofenadine 180mg , cetirizine 10mg  or loratadine 10-20mg  daily for your allergies.  DASH Eating Plan DASH stands for "Dietary Approaches to Stop Hypertension." The DASH eating plan is a healthy eating plan that has been shown to reduce high blood pressure (hypertension). Additional health benefits may include reducing the risk of type 2 diabetes mellitus, heart disease, and stroke. The DASH eating plan may also help with weight loss. WHAT DO I NEED TO KNOW ABOUT THE DASH EATING PLAN? For the DASH eating plan, you will follow these general guidelines:  Choose foods with a percent daily value for sodium of less than 5% (as listed on the food label).  Use salt-free seasonings or herbs instead of table salt or sea salt.  Check with your health care provider or pharmacist before using salt substitutes.  Eat lower-sodium products, often labeled as "lower sodium" or "no salt added."  Eat fresh foods.  Eat more vegetables, fruits, and low-fat dairy products.  Choose whole grains. Look for the word "whole" as the first word in the ingredient list.  Choose fish and skinless chicken or [place] more often than red meat. Limit fish, poultry, and meat to 6 oz (170 g) each day.  Limit sweets, desserts, sugars, and sugary drinks.  Choose heart-healthy fats.  Limit cheese to 1 oz (28 g) per day.  Eat more home-cooked food and less restaurant, buffet, and fast food.  Limit fried foods.  Cook foods using methods other than frying.  Limit canned vegetables. If you do use them, rinse them well to decrease the sodium.  When eating at a restaurant, ask that your food be prepared with less salt, or no salt if possible. WHAT FOODS CAN I EAT? Seek help from a dietitian for individual calorie needs. Grains Whole grain or whole wheat bread. Brown rice. Whole grain or whole wheat pasta. Quinoa, bulgur, and whole grain cereals. Low-sodium cereals. Corn or whole wheat flour tortillas. Whole  grain cornbread. Whole grain crackers. Low-sodium crackers. Vegetables Fresh or frozen vegetables (raw, steamed, roasted, or grilled). Low-sodium or reduced-sodium tomato and vegetable juices. Low-sodium or reduced-sodium tomato sauce and paste. Low-sodium or reduced-sodium canned vegetables.  Fruits All fresh, canned (in natural juice), or frozen fruits. Meat and Other Protein Products Ground beef (85% or leaner), grass-fed beef, or beef trimmed of fat. Skinless chicken or [place]. Ground chicken or [place]. Pork trimmed of fat. All fish and seafood. Eggs. Dried beans, peas, or lentils. Unsalted nuts and seeds. Unsalted canned beans. Dairy Low-fat dairy products, such as skim or 1% milk, 2% or reduced-fat cheeses, low-fat ricotta or cottage cheese, or plain low-fat yogurt. Low-sodium or reduced-sodium cheeses. Fats and Oils Tub margarines without trans fats. Light or reduced-fat mayonnaise and salad dressings (reduced sodium). Avocado. Safflower, olive, or canola oils. Natural peanut or almond butter. Other Unsalted popcorn and pretzels. The items listed above may not be a complete list of recommended foods or beverages. Contact your dietitian for more options. WHAT FOODS ARE NOT RECOMMENDED? Grains White bread. White pasta. White rice. Refined cornbread. Bagels and croissants. Crackers that contain trans fat. Vegetables Creamed or fried vegetables. Vegetables in a cheese sauce. Regular canned vegetables. Regular canned tomato sauce and paste. Regular tomato and vegetable juices. Fruits Dried fruits. Canned fruit in light or heavy syrup. Fruit juice. Meat and Other Protein Products Fatty cuts of meat. Ribs, chicken wings, bacon, sausage, bologna, salami, chitterlings, fatback, hot dogs, bratwurst, and packaged luncheon meats. Salted nuts and seeds. Canned beans with salt. Dairy  Whole or 2% milk, cream, half-and-half, and cream cheese. Whole-fat or sweetened yogurt. Full-fat cheeses or blue  cheese. Nondairy creamers and whipped toppings. Processed cheese, cheese spreads, or cheese curds. Condiments Onion and garlic salt, seasoned salt, table salt, and sea salt. Canned and packaged gravies. Worcestershire sauce. Tartar sauce. Barbecue sauce. Teriyaki sauce. Soy sauce, including reduced sodium. Steak sauce. Fish sauce. Oyster sauce. Cocktail sauce. Horseradish. Ketchup and mustard. Meat flavorings and tenderizers. Bouillon cubes. Hot sauce. Tabasco sauce. Marinades. Taco seasonings. Relishes. Fats and Oils Butter, stick margarine, lard, shortening, ghee, and bacon fat. Coconut, palm kernel, or palm oils. Regular salad dressings. Other Pickles and olives. Salted popcorn and pretzels. The items listed above may not be a complete list of foods and beverages to avoid. Contact your dietitian for more information. WHERE CAN I FIND MORE INFORMATION? National Heart, Lung, and Blood Institute: travelstabloid.com   This information is not intended to replace advice given to you by your health care provider. Make sure you discuss any questions you have with your health care provider.   Document Released: 07/24/2011 Document Revised: 08/25/2014 Document Reviewed: 06/08/2013 Elsevier Interactive Patient Education [employer].

## 2016-05-14 NOTE — Progress Notes (Signed)
Pre visit review using our clinic review tool, if applicable. No additional management support is needed unless otherwise documented below in the visit note. 

## 2016-05-14 NOTE — Progress Notes (Signed)
   Subjective:    Patient ID: Jackson Ayers, male    DOB: 09/18/1980, 35 y.o.   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 levothyroxine (SYNTHROID, LEVOTHROID) 175 MCG tablet TAKE ONE TABLET BY MOUTH ONCE DAILY ON EMPTY STOMACH 30 tablet 0   No current facility-administered medications on file prior to visit.     No Known Allergies  Past Medical History:  Diagnosis Date  . Thyroid disease     No past surgical history on file.  Family History  Problem Relation Age of Onset  . Diabetes Mother   . Hypertension Father     Social History   Social History  . Marital status: Single    Spouse name: N/A  . Number of children: N/A  . Years of education: N/A   Occupational History  . Remodeling and renovation    Social History Main Topics  . Smoking status: Never Smoker  . Smokeless tobacco: Never Used  . Alcohol use No  . Drug use: No  . Sexual activity: Not on file   Other Topics Concern  . Not on file   Social History Narrative       Review of Systems Sleep is not always great---?from allergies Using OTC meds with some success Appetite is fine Weight up slightly    Objective:   Physical Exam  Constitutional: He appears well-developed and well-nourished. No distress.  HENT:  Mouth/Throat: Oropharynx is clear and moist. No oropharyngeal exudate.  Moderate nasal congestion  Neck: Normal range of motion. Neck supple. No thyromegaly present.  Cardiovascular: Normal rate, regular rhythm and normal heart sounds.  Exam reveals no gallop.   No murmur heard. Pulmonary/Chest: Effort normal and breath sounds normal. No respiratory distress. He has no wheezes. He has  no rales.  Musculoskeletal: He exhibits no edema.  Lymphadenopathy:    He has no cervical adenopathy.          Assessment & Plan:

## 2016-05-16 ENCOUNTER — Telehealth: Payer: Self-pay | Admitting: Internal Medicine

## 2016-05-16 NOTE — Telephone Encounter (Signed)
Patient returned Jackson Ayers's call. °

## 2016-05-19 NOTE — Telephone Encounter (Signed)
See lab results.  

## 2016-06-11 ENCOUNTER — Other Ambulatory Visit: Payer: Self-pay

## 2016-06-11 ENCOUNTER — Other Ambulatory Visit: Payer: Self-pay | Admitting: Internal Medicine

## 2016-06-11 MED ORDER — LEVOTHYROXINE SODIUM 175 MCG PO TABS: ORAL_TABLET | ORAL | 1 refills | Status: DC

## 2016-06-11 NOTE — Telephone Encounter (Signed)
Rx sent electronically.  

## 2016-06-17 ENCOUNTER — Encounter (HOSPITAL_COMMUNITY): Payer: Self-pay | Admitting: Emergency Medicine

## 2016-06-17 DIAGNOSIS — Y92410 Unspecified street and highway as the place of occurrence of the external cause: Secondary | ICD-10-CM | POA: Insufficient documentation

## 2016-06-17 DIAGNOSIS — S50812A Abrasion of left forearm, initial encounter: Secondary | ICD-10-CM | POA: Insufficient documentation

## 2016-06-17 DIAGNOSIS — Y939 Activity, unspecified: Secondary | ICD-10-CM | POA: Insufficient documentation

## 2016-06-17 DIAGNOSIS — S20212A Contusion of left front wall of thorax, initial encounter: Secondary | ICD-10-CM | POA: Insufficient documentation

## 2016-06-17 DIAGNOSIS — S299XXA Unspecified injury of thorax, initial encounter: Secondary | ICD-10-CM | POA: Diagnosis present

## 2016-06-17 DIAGNOSIS — Y999 Unspecified external cause status: Secondary | ICD-10-CM | POA: Insufficient documentation

## 2016-06-17 DIAGNOSIS — Z23 Encounter for immunization: Secondary | ICD-10-CM | POA: Insufficient documentation
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
## 2016-06-18 ENCOUNTER — Emergency Department (HOSPITAL_COMMUNITY): Payer: BLUE CROSS/BLUE SHIELD

## 2016-06-18 ENCOUNTER — Emergency Department (HOSPITAL_COMMUNITY)
Admission: EM | Admit: 2016-06-18 | Discharge: 2016-06-18 | Disposition: A | Discharge status: Home / Self Care | Payer: BLUE CROSS/BLUE SHIELD | Attending: Emergency Medicine | Admitting: Emergency Medicine

## 2016-06-18 DIAGNOSIS — T148XXA Other injury of unspecified body region, initial encounter: Secondary | ICD-10-CM

## 2016-06-18 DIAGNOSIS — S20212A Contusion of left front wall of thorax, initial encounter: Secondary | ICD-10-CM

## 2016-06-18 MED ORDER — NAPROXEN 500 MG PO TABS: 500.0000 mg | ORAL_TABLET | Freq: Two times a day (BID) | ORAL | 0 refills | Status: DC

## 2016-06-18 MED ORDER — BACITRACIN ZINC 500 UNIT/GM EX OINT: 1.0000 "application " | TOPICAL_OINTMENT | Freq: Two times a day (BID) | CUTANEOUS | 0 refills | Status: DC

## 2016-06-18 MED ORDER — BACITRACIN ZINC 500 UNIT/GM EX OINT
TOPICAL_OINTMENT | Freq: Two times a day (BID) | CUTANEOUS | Status: DC
  Administered 2016-06-18: 06:00:00 via TOPICAL
  Filled 2016-06-18: qty 0.9

## 2016-06-18 MED ORDER — KETOROLAC TROMETHAMINE 15 MG/ML IJ SOLN
15.0000 mg | Freq: Once | INTRAMUSCULAR | Status: AC
  Administered 2016-06-18: 15 mg via INTRAMUSCULAR
  Filled 2016-06-18: qty 1

## 2016-06-18 MED ORDER — TETANUS-DIPHTH-ACELL PERTUSSIS 5-2.5-18.5 LF-MCG/0.5 IM SUSP
0.5000 mL | Freq: Once | INTRAMUSCULAR | Status: AC
  Administered 2016-06-18: 0.5 mL via INTRAMUSCULAR
  Filled 2016-06-18: qty 0.5

## 2016-06-18 NOTE — ED Notes (Signed)
Patient transported to X-ray 
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
## 2016-06-24 ENCOUNTER — Telehealth: Payer: Self-pay
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
## 2016-06-24 NOTE — Telephone Encounter (Signed)
PLEASE NOTE: All timestamps contained within this report are represented as [place] Standard Time. CONFIDENTIALTY NOTICE: This fax transmission is intended only for the addressee. It contains information that is legally privileged, confidential or otherwise protected from use or disclosure. If you are not the intended recipient, you are strictly prohibited from reviewing, disclosing, copying using or disseminating any of this information or taking any action in reliance on or regarding this information. If you have received this fax in error, please notify us immediately by telephone so that we can arrange for its return to [place]. Phone: [phone], Toll-Free: [phone], Fax: [phone] Page: 1 of 1 Call Id: [id number] Miranda Day - Client Nonclinical Telephone Record [facility] Day - Client Client Site [facility] - Day Physician Viviana Simpler - MD Contact Type Call Who Is Calling Patient / Member / Family / Caregiver Caller Name Jackson Ayers Phone Number [phone] Patient Name Jackson Ayers Call Type Message Only Information Provided Reason for Call Request to Schedule Office Appointment Initial Comment Caller States he would like to make an appointment for tomorrow, declined triage Call Closed By: Antonietta Barcelona Transaction Date/Time: 06/23/2016 6:57:02 PM (ET)
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
## 2016-06-24 NOTE — Telephone Encounter (Signed)
Spoke with PCP, we will see patient, however [facility] does not file third party insurance.  Glad to see patient at our office as self pay, however no available appointments today

## 2016-06-26 ENCOUNTER — Ambulatory Visit (HOSPITAL_COMMUNITY)
Admission: EM | Admit: 2016-06-26 | Discharge: 2016-06-26 | Disposition: A | Discharge status: Home / Self Care | Payer: BLUE CROSS/BLUE SHIELD | Attending: Emergency Medicine | Admitting: Emergency Medicine

## 2016-06-26 ENCOUNTER — Encounter (HOSPITAL_COMMUNITY): Payer: Self-pay | Admitting: Emergency Medicine

## 2016-06-26 DIAGNOSIS — S161XXD Strain of muscle, fascia and tendon at neck level, subsequent encounter: Secondary | ICD-10-CM | POA: Diagnosis not present

## 2016-06-26 MED ORDER — NAPROXEN 375 MG PO TABS: 375.0000 mg | ORAL_TABLET | Freq: Two times a day (BID) | ORAL | 0 refills | Status: DC
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
## 2016-06-26 NOTE — Discharge Instructions (Signed)
Apply heat to the muscles of your neck. Where the cervical collar for the next week. May remove it periodically.
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
## 2016-07-02 ENCOUNTER — Ambulatory Visit (INDEPENDENT_AMBULATORY_CARE_PROVIDER_SITE_OTHER)
Admission: RE | Admit: 2016-07-02 | Discharge: 2016-07-02 | Disposition: A | Discharge status: Home / Self Care | Payer: BLUE CROSS/BLUE SHIELD | Source: Ambulatory Visit | Attending: Internal Medicine | Admitting: Internal Medicine

## 2016-07-02 ENCOUNTER — Ambulatory Visit (INDEPENDENT_AMBULATORY_CARE_PROVIDER_SITE_OTHER): Payer: BLUE CROSS/BLUE SHIELD | Admitting: Internal Medicine

## 2016-07-02 ENCOUNTER — Encounter: Payer: Self-pay | Admitting: Internal Medicine

## 2016-07-02 VITALS — BP 96/66 | HR 105 | Temp 98.1°F | Wt 213.0 lb

## 2016-07-02 DIAGNOSIS — M542 Cervicalgia: Secondary | ICD-10-CM

## 2016-07-02 NOTE — Patient Instructions (Signed)
Please try aleve 2 tabs twice a day for now
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
## 2016-07-02 NOTE — Progress Notes (Signed)
Pre visit review using our clinic review tool, if applicable. No additional management support is needed unless otherwise documented below in the visit note. 
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
## 2016-07-17 ENCOUNTER — Other Ambulatory Visit: Payer: Self-pay | Admitting: Internal Medicine

## 2016-07-17 DIAGNOSIS — E039 Hypothyroidism, unspecified: Secondary | ICD-10-CM

## 2016-07-24 ENCOUNTER — Other Ambulatory Visit: Payer: BLUE CROSS/BLUE SHIELD

## 2016-07-28 ENCOUNTER — Other Ambulatory Visit (INDEPENDENT_AMBULATORY_CARE_PROVIDER_SITE_OTHER): Payer: BLUE CROSS/BLUE SHIELD

## 2016-07-28 DIAGNOSIS — E039 Hypothyroidism, unspecified: Secondary | ICD-10-CM

## 2016-07-28 LAB — TSH: TSH: 2.12 u[IU]/mL (ref 0.35–4.50)

## 2016-07-28 LAB — T4, FREE: FREE T4: 1.09 ng/dL (ref 0.60–1.60)

## 2016-07-31 ENCOUNTER — Telehealth: Payer: Self-pay

## 2016-07-31 NOTE — Telephone Encounter (Signed)
Hoyle Sauer request records from 06-17-16 to present---there are no records from [facility] for this patient for these DOS.

## 2016-08-28 ENCOUNTER — Telehealth: Payer: Self-pay

## 2016-08-28 ENCOUNTER — Other Ambulatory Visit: Payer: Self-pay

## 2016-08-28 MED ORDER — LEVOTHYROXINE SODIUM 175 MCG PO TABS: ORAL_TABLET | ORAL | 3 refills | Status: DC

## 2016-08-28 NOTE — Telephone Encounter (Signed)
PLEASE NOTE: All timestamps contained within this report are represented as [place] Standard Time. CONFIDENTIALTY NOTICE: This fax transmission is intended only for the addressee. It contains information that is legally privileged, confidential or otherwise protected from use or disclosure. If you are not the intended recipient, you are strictly prohibited from reviewing, disclosing, copying using or disseminating any of this information or taking any action in reliance on or regarding this information. If you have received this fax in error, please notify us immediately by telephone so that we can arrange for its return to [place]. Phone: [phone], Toll-Free: [phone], Fax: [phone] Page: 1 of 1 Call Id: [id number] [place] [facility] - Client Nonclinical Telephone Record [place] [facility] - Client Client Site [place] - Day Physician Viviana Simpler - MD Contact Type Call Who Is Calling Patient / Member / Family / Caregiver Caller Name Namir Allee Phone Number [phone] Patient Name Jushua Town Call Type Message Only Information Provided Reason for Call Request for General Office Information Initial Comment Caller needs refills, declined triage, requests call back. Call Closed By: Caryl Comes Transaction Date/Time: 08/28/2016 7:50:55 AM (ET)

## 2016-08-28 NOTE — Telephone Encounter (Signed)
I spoke with pt who was requesting refill levothyroxine. Pt requested review of recent lab results; done and pt voiced understanding. Pt will ck with pharmacy later today.

## 2016-08-28 NOTE — Telephone Encounter (Signed)
error 

## 2017-04-24 ENCOUNTER — Encounter: Payer: Self-pay | Admitting: Internal Medicine

## 2017-04-24 ENCOUNTER — Ambulatory Visit (INDEPENDENT_AMBULATORY_CARE_PROVIDER_SITE_OTHER): Payer: BLUE CROSS/BLUE SHIELD | Admitting: Internal Medicine

## 2017-04-24 VITALS — BP 118/74 | HR 80 | Temp 97.8°F | Resp 16 | Wt 205.2 lb

## 2017-04-24 DIAGNOSIS — R05 Cough: Secondary | ICD-10-CM | POA: Diagnosis not present

## 2017-04-24 DIAGNOSIS — J45909 Unspecified asthma, uncomplicated: Secondary | ICD-10-CM | POA: Insufficient documentation

## 2017-04-24 DIAGNOSIS — J4520 Mild intermittent asthma, uncomplicated: Secondary | ICD-10-CM | POA: Diagnosis not present

## 2017-04-24 DIAGNOSIS — R059 Cough, unspecified: Secondary | ICD-10-CM

## 2017-04-24 MED ORDER — MONTELUKAST SODIUM 10 MG PO TABS: 10.0000 mg | ORAL_TABLET | Freq: Every day | ORAL | 3 refills | Status: DC

## 2017-04-24 MED ORDER — ALBUTEROL SULFATE HFA 108 (90 BASE) MCG/ACT IN AERS: 2.0000 | INHALATION_SPRAY | Freq: Four times a day (QID) | RESPIRATORY_TRACT | 1 refills | Status: DC | PRN

## 2017-04-24 MED ORDER — ALBUTEROL SULFATE (2.5 MG/3ML) 0.083% IN NEBU
2.5000 mg | INHALATION_SOLUTION | Freq: Once | RESPIRATORY_TRACT | Status: AC
  Administered 2017-04-24: 2.5 mg via RESPIRATORY_TRACT

## 2017-04-24 NOTE — Assessment & Plan Note (Signed)
Seasonal Spirometry shows mild increase in FEV1 with bronchodilator and greater increase in FVC---not significant Will try montelukast (in season) Allegra also Albuterol MDI prn

## 2017-04-24 NOTE — Progress Notes (Signed)
   Subjective:    Patient ID: Jackson Ayers, male    DOB: 1980/12/24, 36 y.o.   MRN: [id number]  HPI Here due to cough  Having dry throat, cough, nosebleeds This has been a recurrent problem--used mucinex in past Has had seasonal problems in past Some degree of allergies Throat gets itchy--ricola helps but it recurs  mucinex helps--night/day formula  Will get SOB at times-- if very bad No clear cut wheezing No fever Has used allegra in past--- seemed to help  Current Outpatient Prescriptions on File Prior to Visit  Medication Sig Dispense Refill  . levothyroxine (SYNTHROID, LEVOTHROID) 175 MCG tablet TAKE ONE TABLET BY MOUTH ONCE DAILY ON EMPTY STOMACH 90 tablet 3   No current facility-administered medications on file prior to visit.     No Known Allergies  Past Medical History:  Diagnosis Date  . Thyroid disease     No past surgical history on file.  Family History  Problem Relation Age of Onset  . Diabetes Mother   . Hypertension Father     Social History   Social History  . Marital status: Single    Spouse name: N/A  . Number of children: N/A  . Years of education: N/A   Occupational History  . Remodeling and renovation    Social History Main Topics  . Smoking status: Never Smoker  . Smokeless tobacco: Never Used  . Alcohol use 0.0 oz/week  . Drug use: No  . Sexual activity: Not on file   Other Topics Concern  . Not on file   Social History Narrative       Review of Systems  No sweats, shakes or chills No vomiting or diarrhea Did have inhaler once but no clear asthma diagnosis (when with Dr Rosario Jacks)     Objective:   Physical Exam  Constitutional:  Tight cough  HENT:  No sinus tenderness Moderate pale nasal congestion TMs normal No pharyngeal injection  Neck: No thyromegaly present.  Pulmonary/Chest:  Slightly decreased breath sounds but fairly normal air movement  Lymphadenopathy:    He has no cervical adenopathy.         Assessment & Plan:

## 2017-04-24 NOTE — Patient Instructions (Signed)
You should take the allegra (fexofenadine) and montelukast regularly in allergy season (March till June and late August till November usually)

## 2017-04-24 NOTE — Addendum Note (Signed)
Addended by: Pilar Grammes on: 04/24/2017 11:16 AM   Modules accepted: Orders

## 2017-04-24 NOTE — Assessment & Plan Note (Signed)
I suspect allergic asthma Will check spirometry

## 2017-05-20 ENCOUNTER — Encounter: Payer: BLUE CROSS/BLUE SHIELD | Admitting: Internal Medicine

## 2017-11-04 ENCOUNTER — Telehealth: Payer: Self-pay | Admitting: Internal Medicine

## 2017-11-05 NOTE — Telephone Encounter (Signed)
Approved: give 6 months and have him schedule physical within that time

## 2017-11-05 NOTE — Telephone Encounter (Signed)
Pt hasn't had a TSH in over a year and no recent or future f/u or CPE appts., please advise

## 2017-11-06 NOTE — Telephone Encounter (Signed)
Rx sent in. Will forward to [place] to schedule CPE.

## 2017-11-06 NOTE — Telephone Encounter (Signed)
I left a message on patient's voice mail to call back and schedule appointment and I put in a CRM.

## 2017-11-06 NOTE — Telephone Encounter (Signed)
Awesome! Thanks

## 2017-11-13 NOTE — Telephone Encounter (Signed)
Spoke to pt. Made him an appt 11-24-17. We discussed that he may want to transfer to [place] or [place] if it is hard for him to get here. Called pharmacy and approved manufacturer change

## 2017-11-13 NOTE — Telephone Encounter (Signed)
Patient calling and stating that levothyroxine (SYNTHROID, LEVOTHROID) 175 MCG tablet has had a manfacture change. Could effect BP. Pharmacy will need approval to feel, call back patient at [phone]

## 2017-11-13 NOTE — Telephone Encounter (Signed)
Okay to change manufacturer and should be coming in (and will do labs to check) soon

## 2017-11-24 ENCOUNTER — Encounter: Payer: Self-pay | Admitting: Internal Medicine

## 2017-11-24 ENCOUNTER — Ambulatory Visit: Payer: BLUE CROSS/BLUE SHIELD | Admitting: Internal Medicine

## 2017-11-24 VITALS — BP 112/70 | HR 80 | Temp 97.8°F | Ht 71.0 in | Wt 219.5 lb

## 2017-11-24 DIAGNOSIS — J4520 Mild intermittent asthma, uncomplicated: Secondary | ICD-10-CM | POA: Diagnosis not present

## 2017-11-24 DIAGNOSIS — E039 Hypothyroidism, unspecified: Secondary | ICD-10-CM

## 2017-11-24 DIAGNOSIS — R0683 Snoring: Secondary | ICD-10-CM

## 2017-11-24 DIAGNOSIS — G4733 Obstructive sleep apnea (adult) (pediatric): Secondary | ICD-10-CM | POA: Insufficient documentation

## 2017-11-24 LAB — CBC
HEMATOCRIT: 47.1 % (ref 39.0–52.0)
HEMOGLOBIN: 16.1 g/dL (ref 13.0–17.0)
MCHC: 34.1 g/dL (ref 30.0–36.0)
MCV: 82.2 fl (ref 78.0–100.0)
Platelets: 232 10*3/uL (ref 150.0–400.0)
RBC: 5.73 Mil/uL (ref 4.22–5.81)
RDW: 14 % (ref 11.5–15.5)
WBC: 5.2 10*3/uL (ref 4.0–10.5)

## 2017-11-24 LAB — COMPREHENSIVE METABOLIC PANEL
ALK PHOS: 78 U/L (ref 39–117)
ALT: 39 U/L (ref 0–53)
AST: 19 U/L (ref 0–37)
Albumin: 4.4 g/dL (ref 3.5–5.2)
BUN: 10 mg/dL (ref 6–23)
CHLORIDE: 104 meq/L (ref 96–112)
CO2: 28 meq/L (ref 19–32)
Calcium: 9.5 mg/dL (ref 8.4–10.5)
Creatinine, Ser: 0.92 mg/dL (ref 0.40–1.50)
GFR: 99.32 mL/min (ref >60.00)
GLUCOSE: 101 mg/dL — AB (ref 70–99)
POTASSIUM: 4.3 meq/L (ref 3.5–5.1)
SODIUM: 139 meq/L (ref 135–145)
TOTAL PROTEIN: 7.3 g/dL (ref 6.0–8.3)
Total Bilirubin: 0.5 mg/dL (ref 0.2–1.2)

## 2017-11-24 LAB — T4, FREE: FREE T4: 1.49 ng/dL (ref 0.60–1.60)

## 2017-11-24 LAB — TSH: TSH: 3.07 u[IU]/mL (ref 0.35–4.50)

## 2017-11-24 NOTE — Assessment & Plan Note (Signed)
Dad considers this severe Hard to tell about daytime issues Will go ahead with referral to see if other testing is indicated

## 2017-11-24 NOTE — Assessment & Plan Note (Signed)
Symptoms are not clear cut now Only using saline nasal spray at this point Dad notes AM cough--but he is not too concerned about symptoms Doesn't remember taking the montelukast---discussed OTC options

## 2017-11-24 NOTE — Progress Notes (Signed)
   Subjective:    Patient ID: Jackson Ayers, male    DOB: 1980/12/06, 37 y.o.   MRN: [id number]  HPI Here with dad for follow up of hypothyroidism and other issues  He doesn't remember much about the montelukast Seems to be better since moving out of parents house and into his own Intermittent congestion No wheezing or SOB of note No regular cough--hasn't been exercising though  Snores very loud if he is lying flat Doesn't happen if sleeps in chair No clear apnea Some daytime somnolence---- fights sleep sometimes after work  Energy levels okay  Current Outpatient Medications on File Prior to Visit  Medication Sig Dispense Refill  . levothyroxine (SYNTHROID, LEVOTHROID) 175 MCG tablet TAKE ONE TABLET BY MOUTH ONCE DAILY ON EMPTY STOMACH 90 tablet 1   No current facility-administered medications on file prior to visit.     No Known Allergies  Past Medical History:  Diagnosis Date  . Thyroid disease     History reviewed. No pertinent surgical history.  Family History  Problem Relation Age of Onset  . Diabetes Mother   . Hypertension Father     Social History   Socioeconomic History  . Marital status: Single    Spouse name: Not on file  . Number of children: Not on file  . Years of education: Not on file  . Highest education level: Not on file  Occupational History  . 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 Financial resource strain: Not on file  . Food insecurity:    Worry: Not on file    Inability: Not on file  . Transportation needs:    Medical: Not on file    Non-medical: Not on file  Tobacco Use  . Smoking status: Never Smoker  . Smokeless tobacco: Never Used  Substance and Sexual Activity  . Alcohol use: Yes    Alcohol/week: 0.0 oz  . Drug use: No  . Sexual activity: Not on file  Lifestyle  . Physical activity:    Days per week: Not on file    Minutes per session: Not on file  . Stress: Not on file  Relationships  . Social  connections:    Talks on phone: Not on file    Gets together: Not on file    Attends religious service: Not on file    Active member of club or organization: Not on file    Attends meetings of clubs or organizations: Not on file    Relationship status: Not on file  . Intimate partner violence:    Fear of current or ex partner: Not on file    Emotionally abused: Not on file    Physically abused: Not on file    Forced sexual activity: Not on file  Other Topics Concern  . Not on file  Social History Narrative        Review of Systems Appetite is okay Weight up--not really doing any exercise    Objective:   Physical Exam  Constitutional: He appears well-developed. No distress.  HENT:  Mouth/Throat: Oropharynx is clear and moist. No oropharyngeal exudate.  Neck: No thyromegaly present.  Pulmonary/Chest: Effort normal and breath sounds normal. No respiratory distress. He has no wheezes. He has no rales.  Musculoskeletal: He exhibits no edema.  Lymphadenopathy:    He has no cervical adenopathy.          Assessment & Plan:

## 2017-11-24 NOTE — Assessment & Plan Note (Signed)
Seems to be euthyroid Will check labs 

## 2018-02-01 ENCOUNTER — Encounter: Payer: Self-pay | Admitting: Pulmonary Disease

## 2018-02-01 ENCOUNTER — Ambulatory Visit (INDEPENDENT_AMBULATORY_CARE_PROVIDER_SITE_OTHER): Payer: BLUE CROSS/BLUE SHIELD | Admitting: Pulmonary Disease

## 2018-02-01 VITALS — BP 110/74 | HR 74 | Ht 71.0 in | Wt 220.0 lb

## 2018-02-01 DIAGNOSIS — R0683 Snoring: Secondary | ICD-10-CM | POA: Diagnosis not present

## 2018-02-01 NOTE — Progress Notes (Signed)
   Subjective:    Patient ID: Jackson Ayers, male    DOB: 14-Apr-1981, 37 y.o.   MRN: [id number]  HPI    Review of Systems  Constitutional: Positive for unexpected weight change. Negative for fever.  HENT: Positive for congestion, nosebleeds and postnasal drip. Negative for dental problem, ear pain, rhinorrhea, sinus pressure, sneezing, sore throat and trouble swallowing.   Eyes: Negative for redness and itching.  Respiratory: Positive for cough, chest tightness and shortness of breath. Negative for wheezing.   Cardiovascular: Negative for palpitations and leg swelling.  Gastrointestinal: Negative for nausea and vomiting.  Genitourinary: Negative for dysuria.  Musculoskeletal: Negative for joint swelling.  Skin: Negative for rash.  Allergic/Immunologic: Positive for environmental allergies and food allergies. Negative for immunocompromised state.  Neurological: Positive for headaches.  Hematological: Does not bruise/bleed easily.  Psychiatric/Behavioral: Negative for dysphoric mood. The patient is nervous/anxious.        Objective:   Physical Exam        Assessment & Plan:

## 2018-02-01 NOTE — Progress Notes (Signed)
[facility]  Chief Complaint  Patient presents with  . sleep consult    Pt referred by Dr. Viviana Simpler MD for possible OSA. Pt wakes up feeling tired, has daytime sleepiness. Pt states takes 40 mins to fall asleep each night, and sleeps restless.     Vital signs: BP 110/74 (BP Location: Left Arm, Cuff Size: Normal)   Pulse 74   Ht 5\' 11"  (1.803 m)   Wt 220 lb (99.8 kg)   SpO2 97%   BMI 30.68 kg/m   History of Present Illness: Jackson Ayers is a 37 y.o. male for evaluation of sleep problems.  He has noticed trouble with his sleep for few years.  He snores, and wakes up feeling choked.  This happens more if he sleeps on his back.  He is a restless sleeper.  His mouth gets dry at night.  He can fall asleep easily if he is sitting quiet, particularly between 3 and 5 pm.  He goes to sleep at midnight.  He falls asleep after 30 minutes.  He wakes up 1 or 2 times to use the bathroom.  He gets out of bed at 9 am.  He feels tired in the morning.  He denies morning headache.  He does not use anything to help him fall sleep or stay awake.  He denies sleep walking, sleep talking, bruxism, or nightmares.  There is no history of restless legs.  He denies sleep hallucinations, sleep paralysis, or cataplexy.  The Epworth score is 5 out of 24.    Physical Exam:  General - pleasant Eyes - pupils reactive ENT - no sinus tenderness, no oral exudate, no LAN, over bite, MP 2, high arched palate, decreased AP diameter Cardiac - regular, no murmur Chest - no wheeze, rales Abd - soft, non tender Ext - no edema Skin - no rashes Neuro - normal strength Psych - normal mood  Discussion: He has snoring, sleep disruption, apnea, and daytime sleepiness.  His father has sleep apnea.  His upper airway anatomy is suggestive of risk for sleep apnea.  We discussed how sleep apnea can affect various health problems, including risks for hypertension,  cardiovascular disease, and diabetes.  We also discussed how sleep disruption can increase risks for accidents, such as while driving.  Weight loss as a means of improving sleep apnea was also reviewed.  Additional treatment options discussed were CPAP therapy, oral appliance, and surgical intervention.  Assessment/Plan:  Snoring. - will arrange for home sleep study - he might be a good candidate for an oral appliance   Patient Instructions  Will arrange for home sleep study Will call to arrange for follow up after sleep study reviewed     Chesley Mires, MD [place] 02/01/2018, 9:51 AM  Flow Sheet  Sleep tests:  Review of Systems: Constitutional: Positive for unexpected weight change. Negative for fever.  HENT: Positive for congestion, nosebleeds and postnasal drip. Negative for dental problem, ear pain, rhinorrhea, sinus pressure, sneezing, sore throat and trouble swallowing.   Eyes: Negative for redness and itching.  Respiratory: Positive for cough, chest tightness and shortness of breath. Negative for wheezing.   Cardiovascular: Negative for palpitations and leg swelling.  Gastrointestinal: Negative for nausea and vomiting.  Genitourinary: Negative for dysuria.  Musculoskeletal: Negative for joint swelling.  Skin: Negative for rash.  Allergic/Immunologic: Positive for environmental allergies and food allergies. Negative for immunocompromised state.  Neurological: Positive for headaches.  Hematological: Does not bruise/bleed  easily.  Psychiatric/Behavioral: Negative for dysphoric mood. The patient is nervous/anxious.    Past Medical History: He  has a past medical history of Thyroid disease.  Past Surgical History: He  has no past surgical history on file.  Family History: His family history includes Diabetes in his mother; Hypertension in his father.  Social History: He  reports that he has never smoked. He has never used smokeless tobacco. He  reports that he drinks alcohol. He reports that he does not use drugs.  Medications: Allergies as of 02/01/2018   No Known Allergies     Medication List        Accurate as of 02/01/18  9:51 AM. Always use your most recent med list.          fexofenadine 60 MG tablet Commonly known as:  ALLEGRA Take 60 mg by mouth 2 (two) times daily.   levothyroxine 175 MCG tablet Commonly known as:  SYNTHROID, LEVOTHROID TAKE ONE TABLET BY MOUTH ONCE DAILY ON EMPTY STOMACH

## 2018-02-01 NOTE — Patient Instructions (Signed)
Will arrange for home sleep study Will call to arrange for follow up after sleep study reviewed  

## 2018-04-22 ENCOUNTER — Telehealth: Payer: Self-pay
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
## 2018-04-22 NOTE — Telephone Encounter (Signed)
I printed copy in immunization record and per pt request mailed to pts verified home address.

## 2018-07-06 ENCOUNTER — Other Ambulatory Visit: Payer: Self-pay | Admitting: Internal Medicine

## 2018-07-21 ENCOUNTER — Ambulatory Visit (INDEPENDENT_AMBULATORY_CARE_PROVIDER_SITE_OTHER): Payer: BLUE CROSS/BLUE SHIELD | Admitting: *Deleted

## 2018-07-21 DIAGNOSIS — Z23 Encounter for immunization: Secondary | ICD-10-CM | POA: Diagnosis not present
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
## 2018-11-30 ENCOUNTER — Encounter: Payer: BLUE CROSS/BLUE SHIELD | Admitting: Internal Medicine

## 2019-04-14 ENCOUNTER — Telehealth: Payer: Self-pay

## 2019-04-14 ENCOUNTER — Other Ambulatory Visit: Payer: Self-pay | Admitting: Internal Medicine

## 2019-04-14 MED ORDER — LEVOTHYROXINE SODIUM 175 MCG PO TABS: ORAL_TABLET | ORAL | 0 refills | Status: DC

## 2019-04-14 NOTE — Telephone Encounter (Signed)
Pt needs a refill on his levothyroxine sent to [facility].

## 2019-05-25 ENCOUNTER — Encounter: Payer: Self-pay | Admitting: Internal Medicine

## 2019-05-25 ENCOUNTER — Ambulatory Visit (INDEPENDENT_AMBULATORY_CARE_PROVIDER_SITE_OTHER): Payer: BLUE CROSS/BLUE SHIELD | Admitting: Internal Medicine

## 2019-05-25 ENCOUNTER — Other Ambulatory Visit: Payer: Self-pay

## 2019-05-25 VITALS — BP 118/78 | HR 73 | Temp 98.3°F | Ht 71.0 in | Wt 222.0 lb

## 2019-05-25 DIAGNOSIS — Z0000 Encounter for general adult medical examination without abnormal findings: Secondary | ICD-10-CM | POA: Insufficient documentation

## 2019-05-25 DIAGNOSIS — E039 Hypothyroidism, unspecified: Secondary | ICD-10-CM | POA: Diagnosis not present

## 2019-05-25 DIAGNOSIS — K219 Gastro-esophageal reflux disease without esophagitis: Secondary | ICD-10-CM

## 2019-05-25 DIAGNOSIS — R0683 Snoring: Secondary | ICD-10-CM | POA: Diagnosis not present

## 2019-05-25 DIAGNOSIS — F39 Unspecified mood [affective] disorder: Secondary | ICD-10-CM

## 2019-05-25 LAB — COMPREHENSIVE METABOLIC PANEL
ALT: 44 U/L (ref 0–53)
AST: 22 U/L (ref 0–37)
Albumin: 5 g/dL (ref 3.5–5.2)
Alkaline Phosphatase: 79 U/L (ref 39–117)
BUN: 15 mg/dL (ref 6–23)
CO2: 30 mEq/L (ref 19–32)
Calcium: 10.1 mg/dL (ref 8.4–10.5)
Chloride: 102 mEq/L (ref 96–112)
Creatinine, Ser: 1.04 mg/dL (ref 0.40–1.50)
GFR: 80.43 mL/min (ref >60.00)
Glucose, Bld: 104 mg/dL — ABNORMAL HIGH (ref 70–99)
Potassium: 4.3 mEq/L (ref 3.5–5.1)
Sodium: 140 mEq/L (ref 135–145)
Total Bilirubin: 0.7 mg/dL (ref 0.2–1.2)
Total Protein: 8 g/dL (ref 6.0–8.3)

## 2019-05-25 LAB — CBC
HCT: 50.8 % (ref 39.0–52.0)
Hemoglobin: 16.9 g/dL (ref 13.0–17.0)
MCHC: 33.3 g/dL (ref 30.0–36.0)
MCV: 85.6 fl (ref 78.0–100.0)
Platelets: 261 10*3/uL (ref 150.0–400.0)
RBC: 5.94 Mil/uL — ABNORMAL HIGH (ref 4.22–5.81)
RDW: 13.8 % (ref 11.5–15.5)
WBC: 6.2 10*3/uL (ref 4.0–10.5)

## 2019-05-25 LAB — LIPID PANEL
Cholesterol: 181 mg/dL (ref 0–200)
HDL: 40.6 mg/dL (ref >39.00)
LDL Cholesterol: 113 mg/dL — ABNORMAL HIGH (ref 0–99)
NonHDL: 140.05
Total CHOL/HDL Ratio: 4
Triglycerides: 133 mg/dL (ref 0.0–149.0)
VLDL: 26.6 mg/dL (ref 0.0–40.0)

## 2019-05-25 LAB — T4, FREE: Free T4: 1.05 ng/dL (ref 0.60–1.60)

## 2019-05-25 LAB — TSH: TSH: 12.04 u[IU]/mL — ABNORMAL HIGH (ref 0.35–4.50)

## 2019-05-25 MED ORDER — OMEPRAZOLE 20 MG PO CPDR: 20.0000 mg | DELAYED_RELEASE_CAPSULE | Freq: Every day | ORAL | 3 refills | Status: DC

## 2019-05-25 MED ORDER — LEVOTHYROXINE SODIUM 175 MCG PO TABS: ORAL_TABLET | ORAL | 3 refills | Status: DC
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
## 2019-05-25 NOTE — Assessment & Plan Note (Signed)
He notes anxiety, etc Will go ahead with behavioral health referral

## 2019-05-25 NOTE — Addendum Note (Signed)
Addended by: Pilar Grammes on: 05/25/2019 08:58 AM   Modules accepted: Orders

## 2019-05-25 NOTE — Assessment & Plan Note (Signed)
Will send note to Dr Halford Chessman to see if we can get the sleep study

## 2019-05-25 NOTE — Assessment & Plan Note (Signed)
Seems euthyroid ?Will check labs ?

## 2019-05-25 NOTE — Assessment & Plan Note (Signed)
Dentist (his brother) has noted enamel loss Will start PPI

## 2019-05-25 NOTE — Assessment & Plan Note (Signed)
Healthy Trying to get back to more fitness efforts Had flu vaccine last month

## 2019-05-30 NOTE — Progress Notes (Signed)
Please Dr. Juanetta Gosling note.

## 2019-06-07 ENCOUNTER — Telehealth: Payer: Self-pay | Admitting: General Surgery

## 2019-06-07 NOTE — Progress Notes (Signed)
LVMTCB for patient, requested call back to set up televisit or clinic visit. Last visit 02/01/18.

## 2019-06-07 NOTE — Telephone Encounter (Signed)
LVMTCB for patient, has not been seen since 02/01/18. Did not have the home sleep test. Will need televisit or OV in order to put new order.  Patient can have visit with Dr. Halford Chessman or NP.

## 2019-10-22 ENCOUNTER — Ambulatory Visit: Payer: Self-pay | Attending: Internal Medicine

## 2019-10-22 DIAGNOSIS — Z23 Encounter for immunization: Secondary | ICD-10-CM | POA: Insufficient documentation

## 2019-10-22 NOTE — Progress Notes (Signed)
   Covid-19 Vaccination Clinic  Name:  Jackson Ayers    MRN: [id number] DOB: [birth year]  10/22/2019  Mr. Denard was observed post Covid-19 immunization for 15 minutes without incident. He was provided with Vaccine Information Sheet and instruction to access the V-Safe system.   Mr. Heidelberg was instructed to call 911 with any severe reactions post vaccine: Marland Kitchen Difficulty breathing  . Swelling of face and throat  . A fast heartbeat  . A bad rash all over body  . Dizziness and weakness   Immunizations Administered    Name Date Dose VIS Date Route   Pfizer COVID-19 Vaccine 10/22/2019  5:03 PM 0.3 mL 07/29/2019 Intramuscular   Manufacturer: [place]   Lot: [id number]   [place]: [id number]

## 2019-11-22 ENCOUNTER — Ambulatory Visit: Payer: Self-pay | Attending: Internal Medicine

## 2019-11-22 DIAGNOSIS — Z23 Encounter for immunization: Secondary | ICD-10-CM

## 2019-11-22 NOTE — Progress Notes (Signed)
   Covid-19 Vaccination Clinic  Name:  Jackson Ayers    MRN: [id number] DOB: 1980/12/18  11/22/2019  Mr. Deyo was observed post Covid-19 immunization for 15 minutes without incident. He was provided with Vaccine Information Sheet and instruction to access the V-Safe system.   Mr. Lichtenstein was instructed to call 911 with any severe reactions post vaccine: Marland Kitchen Difficulty breathing  . Swelling of face and throat  . A fast heartbeat  . A bad rash all over body  . Dizziness and weakness   Immunizations Administered    Name Date Dose VIS Date Route   Pfizer COVID-19 Vaccine 11/22/2019  9:23 AM 0.3 mL 07/29/2019 Intramuscular   Manufacturer: [employer]   Lot: [id number]   [place]: [id number]

## 2020-02-02 ENCOUNTER — Encounter: Payer: Self-pay | Admitting: Internal Medicine

## 2020-02-02 ENCOUNTER — Telehealth (INDEPENDENT_AMBULATORY_CARE_PROVIDER_SITE_OTHER): Payer: 59 | Admitting: Internal Medicine

## 2020-02-02 ENCOUNTER — Other Ambulatory Visit: Payer: Self-pay

## 2020-02-02 DIAGNOSIS — K219 Gastro-esophageal reflux disease without esophagitis: Secondary | ICD-10-CM

## 2020-02-02 MED ORDER — OMEPRAZOLE 20 MG PO CPDR: 20.0000 mg | DELAYED_RELEASE_CAPSULE | Freq: Every day | ORAL | 3 refills | Status: DC

## 2020-02-02 NOTE — Progress Notes (Signed)
Subjective:    Patient ID: Jackson Ayers, male    DOB: [birth year], 39 y.o.   MRN: [id number]  HPI Video virtual visit due to scratchy throat Identification done Reviewed limitations and billing and he gave consent Participants--patient at his work with his faither and I am in my office  For the last 3 days, will eat late and then go to bed (maybe a week) Having acid stuck in his throat Burning in throat Makes him cough  Did try elevating his head with pillows last night--it seemed to help Gets heartburn in the day also---it really seems like this is causing his cough  No dysphagia now Has taken the omeprazole occasionally--but has forgotten it recently  Current Outpatient Medications on File Prior to Visit  Medication Sig Dispense Refill  . fexofenadine (ALLEGRA) 60 MG tablet Take 60 mg by mouth 2 (two) times daily.    Marland Kitchen levothyroxine (SYNTHROID) 175 MCG tablet TAKE 1 TABLET BY MOUTH ONCE DAILY ON AN EMPTY STOMACH 90 tablet 3  . omeprazole (PRILOSEC) 20 MG capsule Take 1 capsule (20 mg total) by mouth at bedtime. (Patient not taking: Reported on 02/02/2020) 90 capsule 3   No current facility-administered medications on file prior to visit.    No Known Allergies  Past Medical History:  Diagnosis Date  . Thyroid disease     History reviewed. No pertinent surgical history.  Family History  Problem Relation Age of Onset  . Diabetes Mother   . Hypertension Father     Social History   Socioeconomic History  . Marital status: Single    Spouse name: Not on file  . Number of children: Not on file  . Years of education: Not on file  . Highest education level: Not on file  Occupational History  . Occupation: Maintenance, Banker: [employer]    Comment: [employer] stores  Tobacco Use  . Smoking status: Never Smoker  . Smokeless tobacco: Never Used  Vaping Use  . Vaping Use: Never used  Substance and Sexual Activity  . Alcohol use: Yes    Alcohol/week: 0.0  standard drinks  . Drug use: No  . Sexual activity: Not on file  Other Topics Concern  . Not on file  Social History Narrative       Social Determinants of Health   Financial Resource Strain:   . Difficulty of Paying Living Expenses:   Food Insecurity:   . Worried About Charity fundraiser in the Last Year:   . Arboriculturist in the Last Year:   Transportation Needs:   . Film/video editor (Medical):   Marland Kitchen Lack of Transportation (Non-Medical):   Physical Activity:   . Days of Exercise per Week:   . Minutes of Exercise per Session:   Stress:   . Feeling of Stress :   Social Connections:   . Frequency of Communication with Friends and Family:   . Frequency of Social Gatherings with Friends and Family:   . Attends Religious Services:   . Active Member of Clubs or Organizations:   . Attends Archivist Meetings:   Marland Kitchen Marital Status:   Intimate Partner Violence:   . Fear of Current or Ex-Partner:   . Emotionally Abused:   Marland Kitchen Physically Abused:   . Sexually Abused:    Review of Systems Not sick Has some allergy symptoms No SOB    Objective:   Physical Exam  Respiratory: Effort normal.  GI: Normal appearance.  Neurological: He is alert.  Psychiatric: His behavior is normal. Mood normal.           Assessment & Plan:

## 2020-02-02 NOTE — Assessment & Plan Note (Signed)
Classic symptoms of his GERD Has dropped off the omeprazole--will renew and told him to make sure he takes it on an empty stomach I asked him to limit late night eating--though tough with his schedule. He has started to elevate his bed If symptoms continue--would increase omeprazole to bid and then consider GI consultation

## 2020-05-28 ENCOUNTER — Encounter: Payer: BLUE CROSS/BLUE SHIELD | Admitting: Internal Medicine

## 2020-06-12 ENCOUNTER — Encounter: Payer: Self-pay | Admitting: Internal Medicine

## 2020-10-30 ENCOUNTER — Other Ambulatory Visit: Payer: Self-pay

## 2020-10-30 ENCOUNTER — Encounter: Payer: Self-pay | Admitting: Internal Medicine

## 2020-10-30 ENCOUNTER — Ambulatory Visit (INDEPENDENT_AMBULATORY_CARE_PROVIDER_SITE_OTHER): Payer: 59 | Admitting: Internal Medicine

## 2020-10-30 VITALS — BP 112/78 | HR 80 | Temp 97.7°F | Ht 71.25 in | Wt 241.0 lb

## 2020-10-30 DIAGNOSIS — J301 Allergic rhinitis due to pollen: Secondary | ICD-10-CM | POA: Diagnosis not present

## 2020-10-30 DIAGNOSIS — E039 Hypothyroidism, unspecified: Secondary | ICD-10-CM | POA: Diagnosis not present

## 2020-10-30 DIAGNOSIS — Z0000 Encounter for general adult medical examination without abnormal findings: Secondary | ICD-10-CM | POA: Diagnosis not present

## 2020-10-30 DIAGNOSIS — K219 Gastro-esophageal reflux disease without esophagitis: Secondary | ICD-10-CM | POA: Diagnosis not present

## 2020-10-30 LAB — COMPREHENSIVE METABOLIC PANEL
ALT: 62 U/L — ABNORMAL HIGH (ref 0–53)
AST: 26 U/L (ref 0–37)
Albumin: 4.4 g/dL (ref 3.5–5.2)
Alkaline Phosphatase: 81 U/L (ref 39–117)
BUN: 11 mg/dL (ref 6–23)
CO2: 27 mEq/L (ref 19–32)
Calcium: 9.6 mg/dL (ref 8.4–10.5)
Chloride: 102 mEq/L (ref 96–112)
Creatinine, Ser: 0.93 mg/dL (ref 0.40–1.50)
GFR: 104.34 mL/min (ref >60.00)
Glucose, Bld: 117 mg/dL — ABNORMAL HIGH (ref 70–99)
Potassium: 4.1 mEq/L (ref 3.5–5.1)
Sodium: 138 mEq/L (ref 135–145)
Total Bilirubin: 0.5 mg/dL (ref 0.2–1.2)
Total Protein: 7.2 g/dL (ref 6.0–8.3)

## 2020-10-30 LAB — T4, FREE: Free T4: 1.15 ng/dL (ref 0.60–1.60)

## 2020-10-30 LAB — TSH: TSH: 14.64 u[IU]/mL — ABNORMAL HIGH (ref 0.35–4.50)

## 2020-10-30 LAB — CBC
HCT: 47 % (ref 39.0–52.0)
Hemoglobin: 15.9 g/dL (ref 13.0–17.0)
MCHC: 33.8 g/dL (ref 30.0–36.0)
MCV: 83.6 fl (ref 78.0–100.0)
Platelets: 257 10*3/uL (ref 150.0–400.0)
RBC: 5.61 Mil/uL (ref 4.22–5.81)
RDW: 14.4 % (ref 11.5–15.5)
WBC: 10.3 10*3/uL (ref 4.0–10.5)

## 2020-10-30 MED ORDER — LEVOTHYROXINE SODIUM 175 MCG PO TABS: ORAL_TABLET | ORAL | 3 refills | Status: DC

## 2020-10-30 NOTE — Assessment & Plan Note (Signed)
He had congestion, etc---especially in the morning Discussed OTC antihistamine

## 2020-10-30 NOTE — Assessment & Plan Note (Signed)
Seems to be healthy but didn't realize he gained 20#!! Discussed fitness/DASH info given Had flu and COVID vaccines

## 2020-10-30 NOTE — Patient Instructions (Addendum)
You can try loratadine or cetirizine 10mg  at bedtime---it may help the morning congestion.   DASH Eating Plan DASH stands for Dietary Approaches to Stop Hypertension. The DASH eating plan is a healthy eating plan that has been shown to:  Reduce high blood pressure (hypertension).  Reduce your risk for type 2 diabetes, heart disease, and stroke.  Help with weight loss. What are tips for following this plan? Reading food labels  Check food labels for the amount of salt (sodium) per serving. Choose foods with less than 5 percent of the Daily Value of sodium. Generally, foods with less than 300 milligrams (mg) of sodium per serving fit into this eating plan.  To find whole grains, look for the word "whole" as the first word in the ingredient list. Shopping  Buy products labeled as "low-sodium" or "no salt added."  Buy fresh foods. Avoid canned foods and pre-made or frozen meals. Cooking  Avoid adding salt when cooking. Use salt-free seasonings or herbs instead of table salt or sea salt. Check with your health care provider or pharmacist before using salt substitutes.  Do not fry foods. Cook foods using healthy methods such as baking, boiling, grilling, roasting, and broiling instead.  Cook with heart-healthy oils, such as olive, canola, avocado, soybean, or sunflower oil. Meal planning  Eat a balanced diet that includes: ? 4 or more servings of fruits and 4 or more servings of vegetables each day. Try to fill one-half of your plate with fruits and vegetables. ? 6-8 servings of whole grains each day. ? Less than 6 oz (170 g) of lean meat, poultry, or fish each day. A 3-oz (85-g) serving of meat is about the same size as a deck of cards. One egg equals 1 oz (28 g). ? 2-3 servings of low-fat dairy each day. One serving is 1 cup (237 mL). ? 1 serving of nuts, seeds, or beans 5 times each week. ? 2-3 servings of heart-healthy fats. Healthy fats called omega-3 fatty acids are found in  foods such as walnuts, flaxseeds, fortified milks, and eggs. These fats are also found in cold-water fish, such as sardines, salmon, and mackerel.  Limit how much you eat of: ? Canned or prepackaged foods. ? Food that is high in trans fat, such as some fried foods. ? Food that is high in saturated fat, such as fatty meat. ? Desserts and other sweets, sugary drinks, and other foods with added sugar. ? Full-fat dairy products.  Do not salt foods before eating.  Do not eat more than 4 egg yolks a week.  Try to eat at least 2 vegetarian meals a week.  Eat more home-cooked food and less restaurant, buffet, and fast food.   Lifestyle  When eating at a restaurant, ask that your food be prepared with less salt or no salt, if possible.  If you drink alcohol: ? Limit how much you use to:  0-1 drink a day for women who are not pregnant.  0-2 drinks a day for men. ? Be aware of how much alcohol is in your drink. In the [place] one drink equals one 12 oz bottle of beer (355 mL), one 5 oz glass of wine (148 mL), or one 1 oz glass of hard liquor (44 mL). General information  Avoid eating more than 2,300 mg of salt a day. If you have hypertension, you may need to reduce your sodium intake to 1,500 mg a day.  Work with your health care provider to maintain a  healthy body weight or to lose weight. Ask what an ideal weight is for you.  Get at least 30 minutes of exercise that causes your heart to beat faster (aerobic exercise) most days of the week. Activities may include walking, swimming, or biking.  Work with your health care provider or dietitian to adjust your eating plan to your individual calorie needs. What foods should I eat? Fruits All fresh, dried, or frozen fruit. Canned fruit in natural juice (without added sugar). Vegetables Fresh or frozen vegetables (raw, steamed, roasted, or grilled). Low-sodium or reduced-sodium tomato and vegetable juice. Low-sodium or reduced-sodium tomato  sauce and tomato paste. Low-sodium or reduced-sodium canned vegetables. Grains Whole-grain or whole-wheat bread. Whole-grain or whole-wheat pasta. Brown rice. [place]. Bulgur. Whole-grain and low-sodium cereals. Pita bread. Low-fat, low-sodium crackers. Whole-wheat flour tortillas. Meats and other proteins Skinless chicken or [place]. Ground chicken or [place]. Pork with fat trimmed off. Fish and seafood. Egg whites. Dried beans, peas, or lentils. Unsalted nuts, nut butters, and seeds. Unsalted canned beans. Lean cuts of beef with fat trimmed off. Low-sodium, lean precooked or cured meat, such as sausages or meat loaves. Dairy Low-fat (1%) or fat-free (skim) milk. Reduced-fat, low-fat, or fat-free cheeses. Nonfat, low-sodium ricotta or cottage cheese. Low-fat or nonfat yogurt. Low-fat, low-sodium cheese. Fats and oils Soft margarine without trans fats. Vegetable oil. Reduced-fat, low-fat, or light mayonnaise and salad dressings (reduced-sodium). Canola, safflower, olive, avocado, soybean, and sunflower oils. Avocado. Seasonings and condiments Herbs. Spices. Seasoning mixes without salt. Other foods Unsalted popcorn and pretzels. Fat-free sweets. The items listed above may not be a complete list of foods and beverages you can eat. Contact a dietitian for more information. What foods should I avoid? Fruits Canned fruit in a light or heavy syrup. Fried fruit. Fruit in cream or butter sauce. Vegetables Creamed or fried vegetables. Vegetables in a cheese sauce. Regular canned vegetables (not low-sodium or reduced-sodium). Regular canned tomato sauce and paste (not low-sodium or reduced-sodium). Regular tomato and vegetable juice (not low-sodium or reduced-sodium). Angie Fava. Olives. Grains Baked goods made with fat, such as croissants, muffins, or some breads. Dry pasta or rice meal packs. Meats and other proteins Fatty cuts of meat. Ribs. Fried meat. Berniece Salines. Bologna, salami, and other precooked  or cured meats, such as sausages or meat loaves. Fat from the back of a pig (fatback). Bratwurst. Salted nuts and seeds. Canned beans with added salt. Canned or smoked fish. Whole eggs or egg yolks. Chicken or [place] with skin. Dairy Whole or 2% milk, cream, and half-and-half. Whole or full-fat cream cheese. Whole-fat or sweetened yogurt. Full-fat cheese. Nondairy creamers. Whipped toppings. Processed cheese and cheese spreads. Fats and oils Butter. Stick margarine. Lard. Shortening. Ghee. Bacon fat. Tropical oils, such as coconut, palm kernel, or palm oil. Seasonings and condiments Onion salt, garlic salt, seasoned salt, table salt, and sea salt. Worcestershire sauce. Tartar sauce. Barbecue sauce. Teriyaki sauce. Soy sauce, including reduced-sodium. Steak sauce. Canned and packaged gravies. Fish sauce. Oyster sauce. Cocktail sauce. Store-bought horseradish. Ketchup. Mustard. Meat flavorings and tenderizers. Bouillon cubes. Hot sauces. Pre-made or packaged marinades. Pre-made or packaged taco seasonings. Relishes. Regular salad dressings. Other foods Salted popcorn and pretzels. The items listed above may not be a complete list of foods and beverages you should avoid. Contact a dietitian for more information. Where to find more information  National Heart, Lung, and Blood Institute: https://wilson-eaton.com/  American Heart Association: www.heart.org  Academy of Nutrition and Dietetics: www.eatright.[place]: www.kidney.org Summary  The DASH eating plan is a healthy eating plan that has been shown to reduce high blood pressure (hypertension). It may also reduce your risk for type 2 diabetes, heart disease, and stroke.  When on the DASH eating plan, aim to eat more fresh fruits and vegetables, whole grains, lean proteins, low-fat dairy, and heart-healthy fats.  With the DASH eating plan, you should limit salt (sodium) intake to 2,300 mg a day. If you have hypertension, you may  need to reduce your sodium intake to 1,500 mg a day.  Work with your health care provider or dietitian to adjust your eating plan to your individual calorie needs. This information is not intended to replace advice given to you by your health care provider. Make sure you discuss any questions you have with your health care provider. Document Revised: 07/08/2019 Document Reviewed: 07/08/2019 Elsevier Patient Education  2021 [employer].

## 2020-10-30 NOTE — Progress Notes (Signed)
Subjective:    Patient ID: Jackson Ayers, male    DOB: Dec 22, 1980, 40 y.o.   MRN: [id number]  HPI Here for physical This visit occurred during the SARS-CoV-2 public health emergency.  Safety protocols were in place, including screening questions prior to the visit, additional usage of staff PPE, and extensive cleaning of exam room while observing appropriate contact time as indicated for disinfecting solutions.   Acid reflux seems better Dentist (brother) thinks his teeth are better---he stopped eating late Eating more fish, healthier No regular exercise Has gained 20#  He tries to take the thyroid med first thing in the morning But his bottle should have been done almost 3 months ago  Current Outpatient Medications on File Prior to Visit  Medication Sig Dispense Refill  . levothyroxine (SYNTHROID) 175 MCG tablet TAKE 1 TABLET BY MOUTH ONCE DAILY ON AN EMPTY STOMACH 90 tablet 3   No current facility-administered medications on file prior to visit.    No Known Allergies  Past Medical History:  Diagnosis Date  . Thyroid disease     History reviewed. No pertinent surgical history.  Family History  Problem Relation Age of Onset  . Diabetes Mother   . Hypertension Father     Social History   Socioeconomic History  . Marital status: Single    Spouse name: Not on file  . Number of children: Not on file  . Years of education: Not on file  . Highest education level: Not on file  Occupational History  . Occupation: Best boy: [employer]    Comment: Dad[employer] stores  Tobacco Use  . Smoking status: Never Smoker  . Smokeless tobacco: Never Used  Vaping Use  . Vaping Use: Never used  Substance and Sexual Activity  . Alcohol use: Yes    Alcohol/week: 0.0 standard drinks  . Drug use: No  . Sexual activity: Not on file  Other Topics Concern  . Not on file  Social History Narrative       Social Determinants of Health   Financial Resource Strain: Not on file   Food Insecurity: Not on file  Transportation Needs: Not on file  Physical Activity: Not on file  Stress: Not on file  Social Connections: Not on file  Intimate Partner Violence: Not on file   Review of Systems  Constitutional: Positive for unexpected weight change. Negative for fatigue.  HENT: Positive for congestion. Negative for dental problem, hearing loss and tinnitus.   Eyes: Negative for visual disturbance.       No diplopia or unilateral vision loss  Respiratory: Negative for chest tightness and shortness of breath.        Morning cough only---gets congested and in throat  Cardiovascular: Positive for palpitations. Negative for chest pain and leg swelling.       Feels his heart if he pushes it  Gastrointestinal: Negative for blood in stool.       Heartburn rare---like if he eats pepperoni Tends to get loose stools with milk---discussed lactaid milk  Endocrine: Negative for polydipsia and polyuria.  Genitourinary: Negative for difficulty urinating and dysuria.       No sexual problems  Musculoskeletal: Positive for back pain. Negative for arthralgias and joint swelling.       Episodic back pain--uses vick's vaporub with relief  Skin: Negative for rash.  Allergic/Immunologic: Negative for environmental allergies and immunocompromised state.       Throat dry at times  Neurological: Positive for headaches. Negative for  dizziness, syncope and light-headedness.  Hematological: Negative for adenopathy. Does not bruise/bleed easily.  Psychiatric/Behavioral: Negative for dysphoric mood. The patient is not nervous/anxious.        Sleeps okay---alone so not sure if he snores. Gets 6 hours of sleep mostly--usually awakens refreshed No clear daytime somnolence       Objective:   Physical Exam Constitutional:      Appearance: Normal appearance.  HENT:     Right Ear: Tympanic membrane, ear canal and external ear normal.     Left Ear: Tympanic membrane, ear canal and external ear  normal.     Mouth/Throat:     Pharynx: No oropharyngeal exudate or posterior oropharyngeal erythema.  Eyes:     Conjunctiva/sclera: Conjunctivae normal.     Pupils: Pupils are equal, round, and reactive to light.  Cardiovascular:     Rate and Rhythm: Normal rate and regular rhythm.     Pulses: Normal pulses.     Heart sounds: No murmur heard. No gallop.   Pulmonary:     Effort: Pulmonary effort is normal.     Breath sounds: Normal breath sounds. No wheezing or rales.  Abdominal:     Palpations: Abdomen is soft.     Tenderness: There is no abdominal tenderness.  Musculoskeletal:     Cervical back: Neck supple.     Right lower leg: No edema.     Left lower leg: No edema.  Lymphadenopathy:     Cervical: No cervical adenopathy.  Skin:    General: Skin is warm.     Findings: No rash.  Neurological:     General: No focal deficit present.     Mental Status: He is alert and oriented to person, place, and time.  Psychiatric:        Mood and Affect: Mood normal.        Behavior: Behavior normal.            Assessment & Plan:

## 2020-10-30 NOTE — Assessment & Plan Note (Signed)
Not compliant with med--forgets ??50% of the time Will see where he is---encouraged him to work on remembering better

## 2020-10-30 NOTE — Assessment & Plan Note (Signed)
Seems better  

## 2021-04-20 ENCOUNTER — Encounter (HOSPITAL_BASED_OUTPATIENT_CLINIC_OR_DEPARTMENT_OTHER): Payer: Self-pay | Admitting: Emergency Medicine

## 2021-04-20 ENCOUNTER — Other Ambulatory Visit: Payer: Self-pay

## 2021-04-20 ENCOUNTER — Emergency Department (HOSPITAL_BASED_OUTPATIENT_CLINIC_OR_DEPARTMENT_OTHER)
Admission: EM | Admit: 2021-04-20 | Discharge: 2021-04-20 | Disposition: A | Discharge status: Home / Self Care | Payer: 59 | Attending: Emergency Medicine | Admitting: Emergency Medicine

## 2021-04-20 ENCOUNTER — Emergency Department (HOSPITAL_BASED_OUTPATIENT_CLINIC_OR_DEPARTMENT_OTHER): Payer: 59 | Admitting: Radiology

## 2021-04-20 DIAGNOSIS — E039 Hypothyroidism, unspecified: Secondary | ICD-10-CM | POA: Insufficient documentation

## 2021-04-20 DIAGNOSIS — Z79899 Other long term (current) drug therapy: Secondary | ICD-10-CM | POA: Diagnosis not present

## 2021-04-20 DIAGNOSIS — R1013 Epigastric pain: Secondary | ICD-10-CM

## 2021-04-20 DIAGNOSIS — Z20822 Contact with and (suspected) exposure to covid-19: Secondary | ICD-10-CM | POA: Insufficient documentation

## 2021-04-20 DIAGNOSIS — J45909 Unspecified asthma, uncomplicated: Secondary | ICD-10-CM | POA: Diagnosis not present

## 2021-04-20 DIAGNOSIS — R6883 Chills (without fever): Secondary | ICD-10-CM

## 2021-04-20 LAB — CBC WITH DIFFERENTIAL/PLATELET
Abs Immature Granulocytes: 0.05 10*3/uL (ref 0.00–0.07)
Basophils Absolute: 0.1 10*3/uL (ref 0.0–0.1)
Basophils Relative: 1 %
Eosinophils Absolute: 0 10*3/uL (ref 0.0–0.5)
Eosinophils Relative: 0 %
HCT: 48.2 % (ref 39.0–52.0)
Hemoglobin: 16.1 g/dL (ref 13.0–17.0)
Immature Granulocytes: 1 %
Lymphocytes Relative: 24 %
Lymphs Abs: 2 10*3/uL (ref 0.7–4.0)
MCH: 28 pg (ref 26.0–34.0)
MCHC: 33.4 g/dL (ref 30.0–36.0)
MCV: 83.7 fL (ref 80.0–100.0)
Monocytes Absolute: 0.7 10*3/uL (ref 0.1–1.0)
Monocytes Relative: 9 %
Neutro Abs: 5.6 10*3/uL (ref 1.7–7.7)
Neutrophils Relative %: 65 %
Platelets: 296 10*3/uL (ref 150–400)
RBC: 5.76 MIL/uL (ref 4.22–5.81)
RDW: 13.6 % (ref 11.5–15.5)
WBC: 8.5 10*3/uL (ref 4.0–10.5)
nRBC: 0 % (ref 0.0–0.2)

## 2021-04-20 LAB — COMPREHENSIVE METABOLIC PANEL
ALT: 95 U/L — ABNORMAL HIGH (ref 0–44)
AST: 34 U/L (ref 15–41)
Albumin: 4.5 g/dL (ref 3.5–5.0)
Alkaline Phosphatase: 78 U/L (ref 38–126)
Anion gap: 9 (ref 5–15)
BUN: 15 mg/dL (ref 6–20)
CO2: 27 mmol/L (ref 22–32)
Calcium: 9.6 mg/dL (ref 8.9–10.3)
Chloride: 105 mmol/L (ref 98–111)
Creatinine, Ser: 0.97 mg/dL (ref 0.61–1.24)
GFR, Estimated: >60 mL/min (ref >60)
Glucose, Bld: 106 mg/dL — ABNORMAL HIGH (ref 70–99)
Potassium: 3.9 mmol/L (ref 3.5–5.1)
Sodium: 141 mmol/L (ref 135–145)
Total Bilirubin: 0.8 mg/dL (ref 0.3–1.2)
Total Protein: 7.8 g/dL (ref 6.5–8.1)

## 2021-04-20 LAB — URINALYSIS, ROUTINE W REFLEX MICROSCOPIC
Bilirubin Urine: NEGATIVE
Glucose, UA: NEGATIVE mg/dL
Hgb urine dipstick: NEGATIVE
Leukocytes,Ua: NEGATIVE
Nitrite: NEGATIVE
Specific Gravity, Urine: 1.032 — ABNORMAL HIGH (ref 1.005–1.030)
pH: 6 (ref 5.0–8.0)

## 2021-04-20 LAB — RAPID URINE DRUG SCREEN, HOSP PERFORMED
Amphetamines: NOT DETECTED
Barbiturates: NOT DETECTED
Benzodiazepines: NOT DETECTED
Cocaine: NOT DETECTED
Opiates: NOT DETECTED
Tetrahydrocannabinol: NOT DETECTED

## 2021-04-20 LAB — RESP PANEL BY RT-PCR (FLU A&B, COVID) ARPGX2
Influenza A by PCR: NEGATIVE
Influenza B by PCR: NEGATIVE
SARS Coronavirus 2 by RT PCR: NEGATIVE

## 2021-04-20 MED ORDER — OMEPRAZOLE 20 MG PO CPDR: 20.0000 mg | DELAYED_RELEASE_CAPSULE | Freq: Every day | ORAL | 0 refills | Status: DC

## 2021-04-20 MED ORDER — FAMOTIDINE 20 MG PO TABS
40.0000 mg | ORAL_TABLET | Freq: Once | ORAL | Status: AC
  Administered 2021-04-20: 40 mg via ORAL
  Filled 2021-04-20: qty 2

## 2021-04-20 NOTE — ED Triage Notes (Signed)
Thursday, cough, congested, chills, fever (101).

## 2021-04-20 NOTE — ED Notes (Signed)
Patient transported to X-ray 

## 2021-04-20 NOTE — Discharge Instructions (Addendum)
1.  Take omeprazole daily for the next 30 days. 2.  Follow dietary instructions for gastroesophageal reflux disease. 3.  Earlier he described having some chills.  Possibly you have a early viral illness.  Your tests at this time are normal.  Take Tylenol if you are having chills or body aches.  Return to emergency department if you are getting new or concerning symptoms.

## 2021-04-20 NOTE — ED Provider Notes (Signed)
[facility] EMERGENCY DEPT Provider Note   CSN: [id number] Arrival date & time: 04/20/21  1804     History Chief Complaint  Patient presents with   Chills    Jackson Ayers is a 40 y.o. male with a past medical history of hypothyroid, GERD, asthma, who presents today for evaluation of chills. Patient is difficulty providing history, additional information is obtained from chart review and father at bedside.  Patient has had chills since Thursday night.  He has chronic cough mostly in the mornings which is unchanged.  He has had episodes of chills where he feels like that when he takes a full big deep breath he is not short of breath or having chest pain however it makes his chills worse.  He denies any leg swelling.  No history of DVT/PE.  He does not take any hormones, no hemoptysis.  He states that he did have a episode at work today where he felt lightheaded, became very sweaty upon standing for multiple hours and had to go sit down because he felt like he was going to pass out.  He did not have any loss of consciousness. While he had reported to triage that he since Thursday had had cough, nasal congestion chills and fever he denies nasal congestion, any change in his cough, or any fevers at home to me when I speak with him.  He denies any rashes.  No abdominal pain.  No N/V/D.    Father at bed side states that patient is acting his normal baseline.   HPI     Past Medical History:  Diagnosis Date   Thyroid disease     Patient Active Problem List   Diagnosis Date Noted   Preventative health care 05/25/2019   Snoring 11/24/2017   Allergic asthma 04/24/2017   Allergic rhinitis due to pollen 05/14/2016   Mood disorder ([place]) 09/27/2014   Cough 09/27/2014   GERD (gastroesophageal reflux disease) 04/29/2013   Hypothyroidism 08/06/2007    History reviewed. No pertinent surgical history.     Family History  Problem Relation Age of Onset   Diabetes Mother     Hypertension Father     Social History   Tobacco Use   Smoking status: Never   Smokeless tobacco: Never  Vaping Use   Vaping Use: Never used  Substance Use Topics   Alcohol use: Yes    Alcohol/week: 0.0 standard drinks   Drug use: No    Home Medications Prior to Admission medications   Medication Sig Start Date End Date Taking? Authorizing Provider  levothyroxine (SYNTHROID) 175 MCG tablet TAKE 1 TABLET BY MOUTH ONCE DAILY ON AN EMPTY STOMACH 10/30/20   Venia Carbon, MD    Allergies    Patient has no known allergies.  Review of Systems   Review of Systems  Constitutional:  Positive for chills. Negative for appetite change, fatigue and fever.  Cardiovascular:  Negative for chest pain.  Gastrointestinal:  Negative for abdominal pain, diarrhea, nausea and vomiting.  Genitourinary:  Negative for dysuria.  Musculoskeletal:  Negative for back pain.  Neurological:  Negative for weakness and headaches.  All other systems reviewed and are negative.  Physical Exam Updated Vital Signs BP 114/78 (BP Location: Right Arm)   Pulse 79   Temp 99.2 F (37.3 C)   Resp 16   SpO2 99%   Physical Exam Vitals and nursing note reviewed.  Constitutional:      General: He is not in acute distress.  Appearance: He is not diaphoretic.  HENT:     Head: Normocephalic and atraumatic.  Eyes:     General: No scleral icterus.       Right eye: No discharge.        Left eye: No discharge.     Conjunctiva/sclera: Conjunctivae normal.  Cardiovascular:     Rate and Rhythm: Normal rate and regular rhythm.     Heart sounds: Normal heart sounds. No murmur heard. Pulmonary:     Effort: Pulmonary effort is normal. No respiratory distress.     Breath sounds: Normal breath sounds. No stridor.  Abdominal:     General: There is no distension.  Musculoskeletal:        General: No deformity.     Cervical back: Normal range of motion and neck supple.  Skin:    General: Skin is warm and dry.   Neurological:     Mental Status: He is alert.     Motor: No abnormal muscle tone.     Comments: Patient is awake and alert.    Psychiatric:        Behavior: Behavior normal.    ED Results / Procedures / Treatments   Labs (all labs ordered are listed, but only abnormal results are displayed) Labs Reviewed  COMPREHENSIVE METABOLIC PANEL - Abnormal; Notable for the following components:      Result Value   Glucose, Bld 106 (*)    ALT 95 (*)    All other components within normal limits  URINALYSIS, ROUTINE W REFLEX MICROSCOPIC - Abnormal; Notable for the following components:   Specific Gravity, Urine 1.032 (*)    Ketones, ur TRACE (*)    Protein, ur TRACE (*)    All other components within normal limits  RESP PANEL BY RT-PCR (FLU A&B, COVID) ARPGX2  CBC WITH DIFFERENTIAL/PLATELET  RAPID URINE DRUG SCREEN, HOSP PERFORMED    EKG None  Radiology DG Chest 2 View  Result Date: 04/20/2021 CLINICAL DATA:  Cough, congestion, chills EXAM: CHEST - 2 VIEW COMPARISON:  06/18/2016 FINDINGS: The heart size and mediastinal contours are within normal limits. Both lungs are clear. The visualized skeletal structures are unremarkable. IMPRESSION: No active cardiopulmonary disease. Electronically Signed   By: Rolm Baptise M.D.   On: 04/20/2021 21:30    Procedures Procedures   Medications Ordered in ED Medications - No data to display  ED Course  I have reviewed the triage vital signs and the nursing notes.  Pertinent labs & imaging results that were available during my care of the patient were reviewed by me and considered in my medical decision making (see chart for details).    MDM Rules/Calculators/A&P                          Patient is a 40 year old man who presents today for evaluation of chills.  He reports he had a near syncopal event earlier today.  Chills have been ongoing since Thursday.  Here he is afebrile, not tachycardic or tachypneic.  Patient does have changes in his  heart rate with orthostatic vital signs however does not become hypotensive.  Chest x-ray is obtained for near syncopal event without cause.  COVID and flu test is negative.  He did denies any rashes, no cough or chest pain, no abdominal pain, GI upset or other symptoms.  He is UA shows minimal elevation of ALT however is otherwise reassuring.  CBC is without anemia, leukocytosis or other abnormalities.  At this time UA and UDS are pending.  At shift change care was transferred to Dr. Johnney Killian who will follow pending studies, re-evaulate and determine disposition.    Note: Portions of this report may have been transcribed using voice recognition software. Every effort was made to ensure accuracy; however, inadvertent computerized transcription errors may be present  Final Clinical Impression(s) / ED Diagnoses Final diagnoses:  Chills    Rx / DC Orders ED Discharge Orders     None        Ollen Gross 04/20/21 2214    Charlesetta Shanks, MD 04/20/21 2255

## 2021-04-23 ENCOUNTER — Telehealth: Payer: Self-pay | Admitting: *Deleted

## 2021-04-23 NOTE — Telephone Encounter (Signed)
PLEASE NOTE: All timestamps contained within this report are represented as [place] Standard Time. CONFIDENTIALTY NOTICE: This fax transmission is intended only for the addressee. It contains information that is legally privileged, confidential or otherwise protected from use or disclosure. If you are not the intended recipient, you are strictly prohibited from reviewing, disclosing, copying using or disseminating any of this information or taking any action in reliance on or regarding this information. If you have received this fax in error, please notify us immediately by telephone so that we can arrange for its return to [place]. Phone: [phone], Toll-Free: [phone], Fax: [phone] Page: 1 of 2 Call Id: [id number] [place] RECORD AccessNurse Patient Name: MYLZ STIVERS Gender: Male DOB: 14-Mar-1981 Age: 40 Y 32 M 26 D Return Phone Number: [phone] (Primary), [phone] (Secondary) Address: City/ State/ Zip: [place]  [address] Client [facility] Night - Client Client Site [place] Physician Viviana Simpler- MD Contact Type Call Who Is Calling Patient / Member / Family / Caregiver Call Type Triage / Clinical Relationship To Patient Self Return Phone Number [phone] (Primary) Chief Complaint Muscle Jerks, Tics And Shudders Reason for Call Symptomatic / Request for [place] states he is having chills and random sweating with a slight fever. Translation No Nurse Assessment Nurse: Malachi Carl, RN, Jana Half Date/Time Eilene Ghazi Time): 04/20/2021 4:52:45 PM Confirm and document reason for call. If symptomatic, describe symptoms. ---The caller states that he has been having chills since Thursday. Today he is also having sweating. When he breathes deep, he feels like pins and needles. His covid test was negative today. Does  the patient have any new or worsening symptoms? ---Yes Will a triage be completed? ---Yes Related visit to physician within the last 2 weeks? ---N/A Does the PT have any chronic conditions? (i.e. diabetes, asthma, this includes High risk factors for pregnancy, etc.) ---Yes List chronic conditions. ---Asthma Is this a behavioral health or substance abuse call? ---No Guidelines Guideline Title Affirmed Question Affirmed Notes Nurse Date/Time (Eastern Time) Chest Pain [1] Chest pain lasts > 5 minutes AND [2] occurred in past 3 days (72 hours) (Exception: feels exactly the same as previously diagnosed heartburn and has accompanying sour taste in mouth) Malachi Carl, RN, Jana Half 04/20/2021 4:58:17 PM PLEASE NOTE: All timestamps contained within this report are represented as [place] Standard Time. CONFIDENTIALTY NOTICE: This fax transmission is intended only for the addressee. It contains information that is legally privileged, confidential or otherwise protected from use or disclosure. If you are not the intended recipient, you are strictly prohibited from reviewing, disclosing, copying using or disseminating any of this information or taking any action in reliance on or regarding this information. If you have received this fax in error, please notify us immediately by telephone so that we can arrange for its return to [place]. Phone: [phone], Toll-Free: [phone], Fax: [phone] Page: 2 of 2 Call Id: [id number] [place]. Time Eilene Ghazi Time) Disposition Final User 04/20/2021 5:03:41 PM Go to ED Now (or PCP triage) Yes Malachi Carl, RN, Leward Quan Disagree/Comply Comply Caller Understands Yes PreDisposition InappropriateToAsk Care Advice Given Per Guideline GO TO ED NOW (OR PCP TRIAGE): * IF PCP SECOND-LEVEL TRIAGE REQUIRED: You may need to be seen. Your doctor (or NP/PA) will want to talk with you to decide what's best. I'll page the provider on-call now. If you haven't heard from the  provider (or me) within 30 minutes, go directly  to the [facility] at _____________ Idamay given per Chest Pain (Adult) guideline. * You become worse CALL EMS IF: * Severe difficulty breathing occurs * Passes out or becomes too weak to stand Referrals [facility] - ED

## 2021-04-23 NOTE — Telephone Encounter (Signed)
Patient was seen in ED. 

## 2021-05-01 ENCOUNTER — Ambulatory Visit (INDEPENDENT_AMBULATORY_CARE_PROVIDER_SITE_OTHER): Payer: 59 | Admitting: Internal Medicine

## 2021-05-01 ENCOUNTER — Other Ambulatory Visit: Payer: Self-pay

## 2021-05-01 ENCOUNTER — Encounter: Payer: Self-pay | Admitting: Internal Medicine

## 2021-05-01 VITALS — BP 122/84 | HR 98 | Temp 97.8°F | Ht 71.0 in | Wt 228.0 lb

## 2021-05-01 DIAGNOSIS — Z23 Encounter for immunization: Secondary | ICD-10-CM

## 2021-05-01 DIAGNOSIS — K219 Gastro-esophageal reflux disease without esophagitis: Secondary | ICD-10-CM | POA: Diagnosis not present

## 2021-05-01 NOTE — Telephone Encounter (Signed)
Pt is being seen today for ER F/U

## 2021-05-01 NOTE — Progress Notes (Signed)
   Subjective:    Patient ID: Jackson Ayers, male    DOB: May 15, 1981, 40 y.o.   MRN: [id number]  HPI Here for ER follow up This visit occurred during the SARS-CoV-2 public health emergency.  Safety protocols were in place, including screening questions prior to the visit, additional usage of staff PPE, and extensive cleaning of exam room while observing appropriate contact time as indicated for disinfecting solutions.   Had chills--wondered if he got it from a dog? Lasted several days ER evaluation---tests all negative Not sure if he had fever Chills are gone  Some bloating issues Thinks it may be dietary Given omeprazole in ER---that "messed me up". So he stopped it No reflux now  Current Outpatient Medications on File Prior to Visit  Medication Sig Dispense Refill   levothyroxine (SYNTHROID) 175 MCG tablet TAKE 1 TABLET BY MOUTH ONCE DAILY ON AN EMPTY STOMACH 90 tablet 3   No current facility-administered medications on file prior to visit.    No Known Allergies  Past Medical History:  Diagnosis Date   Thyroid disease     History reviewed. No pertinent surgical history.  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  Tobacco Use   Smoking status: Never   Smokeless tobacco: Never  Vaping Use   Vaping Use: Never used  Substance and Sexual Activity   Alcohol use: Yes    Alcohol/week: 0.0 standard drinks   Drug use: No   Sexual activity: Not on file  Other Topics Concern   Not on file  Social History Narrative       Social Determinants of Health   Financial Resource Strain: Not on file  Food Insecurity: Not on file   Transportation Needs: Not on file  Physical Activity: Not on file  Stress: Not on file  Social Connections: Not on file  Intimate Partner Violence: Not on file   Review of Systems Has lost some of the weight he gained Is exercising    Objective:   Physical Exam Constitutional:      Appearance: Normal appearance.  Cardiovascular:     Rate and Rhythm: Normal rate and regular rhythm.     Heart sounds: No murmur heard.   No gallop.  Pulmonary:     Effort: Pulmonary effort is normal.     Breath sounds: Normal breath sounds. No wheezing or rales.  Abdominal:     Palpations: Abdomen is soft.     Comments: Slight epigastric sensitivity--not really tender  Musculoskeletal:     Cervical back: Neck supple.  Lymphadenopathy:     Cervical: No cervical adenopathy.  Neurological:     Mental Status: He is alert.           Assessment & Plan:

## 2021-05-01 NOTE — Assessment & Plan Note (Signed)
Heartburn is better--and less cough Still lots of bloating Discussed and gave info on low FODMAP diet  Discussed that he could try famotidine or tums prn

## 2021-05-23 ENCOUNTER — Ambulatory Visit (INDEPENDENT_AMBULATORY_CARE_PROVIDER_SITE_OTHER): Payer: 59 | Admitting: Internal Medicine

## 2021-05-23 ENCOUNTER — Ambulatory Visit: Payer: 59 | Admitting: Internal Medicine

## 2021-05-23 ENCOUNTER — Other Ambulatory Visit: Payer: Self-pay

## 2021-05-23 ENCOUNTER — Encounter: Payer: Self-pay | Admitting: Internal Medicine

## 2021-05-23 VITALS — BP 114/74 | HR 83 | Temp 97.6°F | Ht 71.0 in | Wt 216.0 lb

## 2021-05-23 DIAGNOSIS — R1013 Epigastric pain: Secondary | ICD-10-CM | POA: Diagnosis not present

## 2021-05-23 NOTE — Assessment & Plan Note (Signed)
Still seems to be somewhat acid related----water brash, etc Didn't tolerate PPI No help with pepcid Ulcer disease also possible  Discussed trying liquid antacid--like mylanta Will refer to GI---may need EGD

## 2021-05-23 NOTE — Progress Notes (Signed)
   Subjective:    Patient ID: Jackson Ayers, male    DOB: 19-Mar-1981, 40 y.o.   MRN: [id number]  HPI Here with dad due to ongoing GI issues This visit occurred during the SARS-CoV-2 public health emergency.  Safety protocols were in place, including screening questions prior to the visit, additional usage of staff PPE, and extensive cleaning of exam room while observing appropriate contact time as indicated for disinfecting solutions.   Still with epigastric pain Feels a buildup of pressure--like he has to burp----but doesn't Worst in AM---before even getting up Seems some better after eating Also tried famotidine---didn't really help  No nausea Vomited once--and gets watery stuff in his throat  No caffeine now--a lot before Has been working on low FODMAP diet  Has tried gas-x after eating---?some help  Current Outpatient Medications on File Prior to Visit  Medication Sig Dispense Refill   levothyroxine (SYNTHROID) 175 MCG tablet TAKE 1 TABLET BY MOUTH ONCE DAILY ON AN EMPTY STOMACH 90 tablet 3   No current facility-administered medications on file prior to visit.    No Known Allergies  Past Medical History:  Diagnosis Date   Thyroid disease     History reviewed. No pertinent surgical history.  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  Tobacco Use   Smoking status: Never   Smokeless tobacco: Never  Vaping Use   Vaping Use: Never used  Substance and Sexual Activity   Alcohol use: Yes    Alcohol/week: 0.0 standard drinks   Drug use: No   Sexual activity: Not on file  Other Topics Concern   Not on file  Social History  Narrative       Social Determinants of Health   Financial Resource Strain: Not on file  Food Insecurity: Not on file  Transportation Needs: Not on file  Physical Activity: Not on file  Stress: Not on file  Social Connections: Not on file  Intimate Partner Violence: Not on file   Review of Systems Bowels okay---no blood Appetite is fair---but early satiety Breathing feels "heavy" in the morning. No regular cough Has lost a few pounds No NSAIDs    Objective:   Physical Exam Constitutional:      Appearance: Normal appearance.  Cardiovascular:     Rate and Rhythm: Normal rate and regular rhythm.     Heart sounds: No murmur heard.   No gallop.  Pulmonary:     Effort: Pulmonary effort is normal.     Breath sounds: Normal breath sounds. No wheezing or rales.  Abdominal:     General: Bowel sounds are normal.     Palpations: Abdomen is soft.     Tenderness: There is no guarding or rebound.     Comments: Mild epigastric tenderness  Musculoskeletal:     Cervical back: Neck supple.  Lymphadenopathy:     Cervical: No cervical adenopathy.  Neurological:     Mental Status: He is alert.           Assessment & Plan:

## 2021-06-07 ENCOUNTER — Encounter: Payer: Self-pay | Admitting: Gastroenterology

## 2021-06-07 ENCOUNTER — Other Ambulatory Visit (INDEPENDENT_AMBULATORY_CARE_PROVIDER_SITE_OTHER): Payer: 59

## 2021-06-07 ENCOUNTER — Ambulatory Visit (INDEPENDENT_AMBULATORY_CARE_PROVIDER_SITE_OTHER): Payer: 59 | Admitting: Gastroenterology

## 2021-06-07 VITALS — BP 100/60 | HR 67 | Ht 71.0 in | Wt 217.0 lb

## 2021-06-07 DIAGNOSIS — R7401 Elevation of levels of liver transaminase levels: Secondary | ICD-10-CM | POA: Diagnosis not present

## 2021-06-07 DIAGNOSIS — K219 Gastro-esophageal reflux disease without esophagitis: Secondary | ICD-10-CM | POA: Diagnosis not present

## 2021-06-07 LAB — HEPATIC FUNCTION PANEL
ALT: 55 U/L — ABNORMAL HIGH (ref 0–53)
AST: 29 U/L (ref 0–37)
Albumin: 4.6 g/dL (ref 3.5–5.2)
Alkaline Phosphatase: 74 U/L (ref 39–117)
Bilirubin, Direct: 0.2 mg/dL (ref 0.0–0.3)
Total Bilirubin: 0.7 mg/dL (ref 0.2–1.2)
Total Protein: 7.2 g/dL (ref 6.0–8.3)

## 2021-06-07 NOTE — Progress Notes (Signed)
HPI : Bay Wayson is a pleasant 40 year old male with a history of hypothyroidism referred to [place] by Dr. Viviana Simpler for further evaluation of GERD symptoms and dyspepsia.  The patient reports being bothered by bothersome postprandial upper abdominal pain for the past month now.  He experiences very bothersome bloating after meals.  He also has a long history of coughing episodes particularly in the morning.  His father is with him, and states that the patient's dentist told him that he had evidence of reflux and should be evaluated and treated for such.  He denies typical GERD symptoms such as heartburn or acid regurgitation.  He has been treated with acid suppressive medicines such as omeprazole, which he stopped because of side effects (worsen bloating, diarrhea).  He was also prescribed famotidine, which she stopped because it made him feel excessively full after meals.  Currently, he is taking over-the-counter Mylanta, which does help with his symptoms.  He has lost about 30 pounds in the past year due to changes in his diet and as well as decreased p.o. intake because of his postprandial symptoms. He denies any chronic lower GI symptoms to include constipation, diarrhea or blood in the stool.  There is no family history of GI malignancy.  The patient is also noted to have elevated liver enzymes, with ALT of 90 last month.  In March his ALT was 60.  Prior to that, it was normal.  The patient denies any history of regular or heavy alcohol use.     Past Medical History:  Diagnosis Date   Thyroid disease      History reviewed. No pertinent surgical history. Family History  Problem Relation Age of Onset   Diabetes Mother    Hypertension Father    Social History   Tobacco Use   Smoking status: Never   Smokeless tobacco: Never  Vaping Use   Vaping Use: Never used  Substance Use Topics   Alcohol use: Yes    Alcohol/week: 0.0 standard drinks   Drug use: No   Current Outpatient  Medications  Medication Sig Dispense Refill   Cal Carb-Mag Hydrox-Simeth (MYLANTA COAT & COOL PO) Take by mouth.     levothyroxine (SYNTHROID) 175 MCG tablet TAKE 1 TABLET BY MOUTH ONCE DAILY ON AN EMPTY STOMACH 90 tablet 3   No current facility-administered medications for this visit.   No Known Allergies   Review of Systems: All systems reviewed and negative except where noted in HPI.    No results found.  Physical Exam: BP 100/60   Pulse 67   Ht 5\' 11"  (1.803 m)   Wt 217 lb (98.4 kg)   SpO2 99%   BMI 30.27 kg/m  Constitutional: Pleasant,well-developed, Asian male in no acute distress.  Accompanied by father HEENT: Normocephalic and atraumatic. Conjunctivae are normal. No scleral icterus. Cardiovascular: Normal rate, regular rhythm.  Pulmonary/chest: Effort normal and breath sounds normal. No wheezing, rales or rhonchi. Abdominal: Soft, nondistended, nontender. Bowel sounds active throughout. There are no masses palpable. No hepatomegaly. Extremities: no edema Neurological: Alert and oriented to person place and time. Skin: Skin is warm and dry. No rashes noted. Psychiatric: Normal mood and affect. Behavior is normal.  CBC    Component Value Date/Time   WBC 8.5 04/20/2021 2120   RBC 5.76 04/20/2021 2120   HGB 16.1 04/20/2021 2120   HCT 48.2 04/20/2021 2120   PLT 296 04/20/2021 2120   MCV 83.7 04/20/2021 2120   MCH 28.0 04/20/2021 2120  MCHC 33.4 04/20/2021 2120   RDW 13.6 04/20/2021 2120   LYMPHSABS 2.0 04/20/2021 2120   MONOABS 0.7 04/20/2021 2120   EOSABS 0.0 04/20/2021 2120   BASOSABS 0.1 04/20/2021 2120    CMP     Component Value Date/Time   NA 141 04/20/2021 2120   K 3.9 04/20/2021 2120   CL 105 04/20/2021 2120   CO2 27 04/20/2021 2120   GLUCOSE 106 (H) 04/20/2021 2120   BUN 15 04/20/2021 2120   CREATININE 0.97 04/20/2021 2120   CALCIUM 9.6 04/20/2021 2120   PROT 7.8 04/20/2021 2120   ALBUMIN 4.5 04/20/2021 2120   AST 34 04/20/2021 2120   ALT  95 (H) 04/20/2021 2120   ALKPHOS 78 04/20/2021 2120   BILITOT 0.8 04/20/2021 2120   GFRNONAA >60 04/20/2021 2120     ASSESSMENT AND PLAN: 40 year old male with 1+ month of upper abdominal pain dyspepsia and likely more chronic symptoms of atypical or silent reflux (based on report of dental erosions and history of chronic morning cough).  We will perform an upper endoscopy to evaluate for evidence of reflux and like for evidence of gastritis, H. pylori/peptic ulcer disease.  In the meantime, he can continue taking Mylanta as needed.  We discussed potentially trying other acid suppressing medications, but he is currently happy with his level of symptom control with Mylanta.  Avoiding acid suppression will also give [place] a better idea of the role of reflux and H. pylori in his symptoms on endoscopy.   He is also noted to have persistently elevated liver enzymes in a hepatocellular pattern (ALT predominant) for the past several months.  He is overweight but has no other NAFLD risk factors.  No history of heavy alcohol use.  We will obtain a baseline ultrasound of his liver to assess for steatosis, and rule out causes of chronic liver disease (viral, metabolic, autoimmune).  GERD/dyspepsia - EGD to assess for complications of GERD and for H. pylori/peptic ulcer disease - Continue Mylanta for now, consider alternative PPIs based on EGD findings  Elevated ALT - Right upper quadrant ultrasound - Evaluate for causes of chronic liver disease (viral, metabolic, autoimmune)  The details, risks (including bleeding, perforation, infection, missed lesions, medication reactions and possible hospitalization or surgery if complications occur), benefits, and alternatives to EGD with possible biopsy and possible dilation were discussed with the patient and he/she consents to proceed.   Dyson Sevey E. Candis Schatz, MD [facility]   CC: Venia Carbon, MD

## 2021-06-07 NOTE — Patient Instructions (Signed)
If you are age 39 or older, your body mass index should be between 23-30. Your Body mass index is 30.27 kg/m. If this is out of the aforementioned range listed, please consider follow up with your Primary Care Provider.  If you are age 63 or younger, your body mass index should be between 19-25. Your Body mass index is 30.27 kg/m. If this is out of the aformentioned range listed, please consider follow up with your Primary Care Provider.   You have been scheduled for an endoscopy. Please follow written instructions given to you at your visit today. If you use inhalers (even only as needed), please bring them with you on the day of your procedure.   You have been scheduled for an abdominal ultrasound at [facility] (1st floor of hospital) on 06/13/21 at 9am. Please arrive 15 minutes prior to your appointment for registration. Make certain not to have anything to eat or drink 6 hours prior to your appointment. Should you need to reschedule your appointment, please contact radiology at [phone]. This test typically takes about 30 minutes to perform.  Your provider has requested that you go to the basement level for lab work before leaving today. Press "B" on the elevator. The lab is located at the first door on the left as you exit the elevator.   The [place] [facility] providers would like to encourage you to use [facility] to communicate with providers for non-urgent requests or questions.  Due to long hold times on the telephone, sending your provider a message by [facility] may be a faster and more efficient way to get a response.  Please allow 48 business hours for a response.  Please remember that this is for non-urgent requests.   It was a pleasure to see you today!  Thank you for trusting me with your gastrointestinal care!    Scott E.Candis Schatz, MD

## 2021-06-10 LAB — HEPATITIS C ANTIBODY
Hepatitis C Ab: NONREACTIVE
SIGNAL TO CUT-OFF: 0.06 (ref <1.00)

## 2021-06-10 LAB — ANTI-SMOOTH MUSCLE ANTIBODY, IGG: Actin (Smooth Muscle) Antibody (IGG): <20 U (ref <20)

## 2021-06-10 LAB — ALPHA-1-ANTITRYPSIN: A-1 Antitrypsin, Ser: 137 mg/dL (ref 83–199)

## 2021-06-10 LAB — HEPATITIS B SURFACE ANTIGEN: Hepatitis B Surface Ag: NONREACTIVE

## 2021-06-10 LAB — CERULOPLASMIN: Ceruloplasmin: 21 mg/dL (ref 18–36)

## 2021-06-10 LAB — MITOCHONDRIAL ANTIBODIES: Mitochondrial M2 Ab, IgG: <=20 U (ref <=20.0)

## 2021-06-10 LAB — ANA: Anti Nuclear Antibody (ANA): NEGATIVE

## 2021-06-10 LAB — HEPATITIS B SURFACE ANTIBODY,QUALITATIVE: Hep B S Ab: REACTIVE — AB

## 2021-06-11 NOTE — Progress Notes (Signed)
Derrious,  Your tests for causes of chronic liver disease were negative.  Your hepatitis B surface antibody was positive, indicating that you have been vaccinated against hep B.  This does not indicate an infection.  Your liver enzymes came down, but were still slightly elevated.  Please follow through with the ultrasound as discussed.

## 2021-06-13 ENCOUNTER — Ambulatory Visit (HOSPITAL_COMMUNITY)
Admission: RE | Admit: 2021-06-13 | Discharge: 2021-06-13 | Disposition: A | Discharge status: Home / Self Care | Payer: 59 | Source: Ambulatory Visit | Attending: Gastroenterology | Admitting: Gastroenterology

## 2021-06-13 DIAGNOSIS — R7401 Elevation of levels of liver transaminase levels: Secondary | ICD-10-CM | POA: Diagnosis present

## 2021-06-13 DIAGNOSIS — K219 Gastro-esophageal reflux disease without esophagitis: Secondary | ICD-10-CM | POA: Diagnosis not present

## 2021-06-14 NOTE — Progress Notes (Signed)
Jackson Ayers, your ultrasound showed evidence of fatty change (steatosis) which is related to being overweight and improper diet and exercise habits.  No evidence of cirrhosis.  I would like you to follow up with me clinic to discuss this diagnosis further.   In addition, the ultrasound showed an incidental lesion in the liver.  This is not uncommon and is not cause for concern.  But I do want to get an MRI to characterize it further.  Maya,  Can you schedule Jackson Ayers for a follow up clinic visit with me (routine) and get him set up for an MRI liver?

## 2021-07-01 ENCOUNTER — Ambulatory Visit (INDEPENDENT_AMBULATORY_CARE_PROVIDER_SITE_OTHER): Payer: 59 | Admitting: Gastroenterology

## 2021-07-01 ENCOUNTER — Encounter: Payer: Self-pay | Admitting: Gastroenterology

## 2021-07-01 VITALS — BP 100/70 | HR 84 | Ht 70.0 in | Wt 212.2 lb

## 2021-07-01 DIAGNOSIS — R932 Abnormal findings on diagnostic imaging of liver and biliary tract: Secondary | ICD-10-CM

## 2021-07-01 DIAGNOSIS — K760 Fatty (change of) liver, not elsewhere classified: Secondary | ICD-10-CM | POA: Diagnosis not present

## 2021-07-01 DIAGNOSIS — K219 Gastro-esophageal reflux disease without esophagitis: Secondary | ICD-10-CM

## 2021-07-01 NOTE — Patient Instructions (Signed)
If you are age 39 or older, your body mass index should be between 23-30. Your Body mass index is 30.45 kg/m. If this is out of the aforementioned range listed, please consider follow up with your Primary Care Provider.  If you are age 26 or younger, your body mass index should be between 19-25. Your Body mass index is 30.45 kg/m. If this is out of the aformentioned range listed, please consider follow up with your Primary Care Provider.   You have been scheduled for an MRI at [facility]  on . Your appointment time is 7am. Please arrive to admitting (at main entrance of the hospital) 30 minutes prior to your appointment time for registration purposes. Please make certain not to have anything to eat or drink 6 hours prior to your test. In addition, if you have any metal in your body, have a pacemaker or defibrillator, please be sure to let your ordering physician know. This test typically takes 45 minutes to 1 hour to complete. Should you need to reschedule, please call [phone] to do so.  The [place] [facility] providers would like to encourage you to use [facility] to communicate with providers for non-urgent requests or questions.  Due to long hold times on the telephone, sending your provider a message by [facility] may be a faster and more efficient way to get a response.  Please allow 48 business hours for a response.  Please remember that this is for non-urgent requests.   Due to recent changes in healthcare laws, you may see the results of your imaging and laboratory studies on MyChart before your provider has had a chance to review them.  We understand that in some cases there may be results that are confusing or concerning to you. Not all laboratory results come back in the same time frame and the provider may be waiting for multiple results in order to interpret others.  Please give [place] 48 hours in order for your provider to thoroughly review all the results before contacting the office for clarification  of your results.   It was a pleasure to see you today!  Thank you for trusting me with your gastrointestinal care!    Scott E. Candis Schatz, MD

## 2021-07-01 NOTE — Progress Notes (Signed)
HPI : Jackson Ayers is a 40 year old male who I saw in clinic Oct 21st with chronic upper GI symptoms of bloating, coughing and phlegm production as well as chronic ALT elevation.  A RUQUS was performed which showed fatty liver and an indeterminate hypoechoic mass (3cm).  Evaluation for viral, metabolic, autoimmune etiologies of chronic liver disease was negative.  Today, he reports his upper GI symptoms are a little better.  He has symptoms less frequently, and they seem to be more noticeable in the mornings.  He has continued to lose weight through improved diet (reducing amount and frequency of high calorie foods).  He does not exercise, but he is active working on a small farm.  He does not drink alcohol. He continues to take Mylanta for his symptoms, which he says works well.  He was previously intolerant of omeprazole due to side effects of bloating and diarrhea.  Past Medical History:  Diagnosis Date   Thyroid disease   Nonalcoholic fatty liver disease GERD  Family History  Problem Relation Age of Onset   Diabetes Mother    Hypertension Father    Social History   Tobacco Use   Smoking status: Never   Smokeless tobacco: Never  Vaping Use   Vaping Use: Never used  Substance Use Topics   Alcohol use: Yes    Alcohol/week: 0.0 standard drinks   Drug use: No   Current Outpatient Medications  Medication Sig Dispense Refill   levothyroxine (SYNTHROID) 175 MCG tablet TAKE 1 TABLET BY MOUTH ONCE DAILY ON AN EMPTY STOMACH 90 tablet 3   No current facility-administered medications for this visit.   No Known Allergies   Review of Systems: All systems reviewed and negative except where noted in HPI.    US Abdomen Limited RUQ (LIVER/GB)  Result Date: 06/13/2021 CLINICAL DATA:  40 year old male with increased liver enzymes. EXAM: ULTRASOUND ABDOMEN LIMITED RIGHT UPPER QUADRANT COMPARISON:  None. FINDINGS: Gallbladder: No gallstones, pericholecystic fluid, or gallbladder wall  thickening visualized. No sonographic Murphy sign noted by sonographer. Common bile duct: Diameter: 3.6 mm Liver: Moderate diffuse increased echogenicity of the hepatic parenchyma with smooth echotexture. Smooth liver contour. Within the right lobe of the liver is a heterogeneously hypoechoic mass measuring approximately 3.4 x 2.9 x 3.1 cm. No significant internal vascularity on color Doppler. No additional masses are visualized. Portal vein is patent on color Doppler imaging with normal direction of blood flow towards the liver. Other: No perihepatic ascites. IMPRESSION: 1. Indeterminate right lobe hypoechoic mass measuring up to 3.4 cm. Recommend further characterization with contrast enhanced ultrasound or MRI of the abdomen. 2. Moderate hepatic steatosis.  No evidence of cirrhosis. Ruthann Cancer, MD [facility] Electronically Signed   By: Ruthann Cancer M.D.   On: 06/13/2021 13:29    Physical Exam: BP 100/70 (BP Location: Left Arm, Patient Position: Sitting, Cuff Size: Normal)   Pulse 84   Ht 5\' 10"  (1.778 m)   Wt 212 lb 4 oz (96.3 kg)   BMI 30.45 kg/m  Constitutional: Pleasant,well-developed, male in no acute distress.  Accompanied by father HEENT: Normocephalic and atraumatic. Conjunctivae are normal. No scleral icterus. Extremities: no edema Neurological: Alert and oriented to person place and time. Skin: Skin is warm and dry. No rashes noted. Psychiatric: Normal mood and affect. Behavior is normal.  CBC    Component Value Date/Time   WBC 8.5 04/20/2021 2120   RBC 5.76 04/20/2021 2120   HGB 16.1  04/20/2021 2120   HCT 48.2 04/20/2021 2120   PLT 296 04/20/2021 2120   MCV 83.7 04/20/2021 2120   MCH 28.0 04/20/2021 2120   MCHC 33.4 04/20/2021 2120   RDW 13.6 04/20/2021 2120   LYMPHSABS 2.0 04/20/2021 2120   MONOABS 0.7 04/20/2021 2120   EOSABS 0.0 04/20/2021 2120   BASOSABS 0.1 04/20/2021 2120    CMP     Component Value  Date/Time   NA 141 04/20/2021 2120   K 3.9 04/20/2021 2120   CL 105 04/20/2021 2120   CO2 27 04/20/2021 2120   GLUCOSE 106 (H) 04/20/2021 2120   BUN 15 04/20/2021 2120   CREATININE 0.97 04/20/2021 2120   CALCIUM 9.6 04/20/2021 2120   PROT 7.2 06/07/2021 1014   ALBUMIN 4.6 06/07/2021 1014   AST 29 06/07/2021 1014   ALT 55 (H) 06/07/2021 1014   ALKPHOS 74 06/07/2021 1014   BILITOT 0.7 06/07/2021 1014   GFRNONAA >60 04/20/2021 2120     ASSESSMENT AND PLAN: 40 year old male with nonalcoholic fatty liver disease and suspected atypical GERD symptoms.  He is scheduled for an EGD in 2 weeks to assess for evidence of complications of GERD and to assess for hiatal hernia.  He would like to avoid medications, so we spent much of the appointment discussing lifestyle modifications that may help him manage his symptoms.  Much of this discussion overlapped with the management of his fatty liver disease, in that both of these conditions will improve with weight loss and reduction of high carbohydrate/simple sugar foods.  I recommended he focus on reducing intake of these foods, especially sodas, as well as spicy/greasy/fried foods.  Specifically for his GERD, I suggested avoiding citrus/peppermint/chocolate, and to avoid eating large meals, opting for frequent small meals.  He should avoid eating/drinking within 3 hours of bedtime, and consider elevating the head of his bed. We discussed how both GERD and fatty liver disease are typically chronic diseases, but that both can be effectively managed often through lifestyle modifications alone.  If he does have significant esophagitis or other complications of GERD, then he will need at least a month of acid suppressive therapy.  GERD - EGD in 2 weeks - Lifestyle modifications as discussed above - Acid suppressive therapy based on EGD findings  Nonalcoholic liver disease - Lifestyle modifications as described above - Continue weight loss, patient applauded  for weight loss thus far - Continue to avoid alcohol - Check liver enzymes in one year - Repeat ultrasound in 2 years  Hypoechoic mass on [place] - MRI liver  Alexzandrea Normington E. Candis Schatz, MD [facility]  I spent a total of 30 minutes reviewing the patient's medical record, interviewing and examining the patient, discussing his diagnosis and management of his condition going forward, and documenting in the medical record  CC:  Venia Carbon, MD

## 2021-07-10 ENCOUNTER — Ambulatory Visit (HOSPITAL_COMMUNITY): Payer: 59

## 2021-07-10 ENCOUNTER — Ambulatory Visit (HOSPITAL_COMMUNITY): Admission: RE | Admit: 2021-07-10 | Payer: 59 | Source: Ambulatory Visit

## 2021-07-15 ENCOUNTER — Encounter: Payer: Self-pay | Admitting: Certified Registered Nurse Anesthetist

## 2021-07-16 ENCOUNTER — Other Ambulatory Visit: Payer: Self-pay

## 2021-07-16 ENCOUNTER — Encounter: Payer: Self-pay | Admitting: Gastroenterology

## 2021-07-16 ENCOUNTER — Ambulatory Visit (AMBULATORY_SURGERY_CENTER): Payer: 59 | Admitting: Gastroenterology

## 2021-07-16 VITALS — BP 97/63 | HR 68 | Temp 97.7°F | Resp 12 | Ht 70.0 in | Wt 212.0 lb

## 2021-07-16 DIAGNOSIS — K3189 Other diseases of stomach and duodenum: Secondary | ICD-10-CM | POA: Diagnosis not present

## 2021-07-16 DIAGNOSIS — K2980 Duodenitis without bleeding: Secondary | ICD-10-CM | POA: Diagnosis not present

## 2021-07-16 DIAGNOSIS — K760 Fatty (change of) liver, not elsewhere classified: Secondary | ICD-10-CM

## 2021-07-16 DIAGNOSIS — K2100 Gastro-esophageal reflux disease with esophagitis, without bleeding: Secondary | ICD-10-CM

## 2021-07-16 DIAGNOSIS — K2970 Gastritis, unspecified, without bleeding: Secondary | ICD-10-CM

## 2021-07-16 DIAGNOSIS — K2950 Unspecified chronic gastritis without bleeding: Secondary | ICD-10-CM | POA: Diagnosis not present

## 2021-07-16 MED ORDER — PANTOPRAZOLE SODIUM 40 MG PO TBEC: 40.0000 mg | DELAYED_RELEASE_TABLET | Freq: Every day | ORAL | 0 refills | Status: DC

## 2021-07-16 MED ORDER — SODIUM CHLORIDE 0.9 % IV SOLN: 500.0000 mL | Freq: Once | INTRAVENOUS | Status: DC

## 2021-07-16 NOTE — Progress Notes (Signed)
History and Physical Interval Note:  07/16/2021 2:46 PM  Jackson Ayers  has presented today for endoscopic procedure(s), with the diagnosis of  Encounter Diagnosis  Name Primary?   NAFL (nonalcoholic fatty liver) Yes  GERD/dyspepsia  .  The various methods of evaluation and treatment have been discussed with the patient and/or family. After consideration of risks, benefits and other options for treatment, the patient has consented to  the endoscopic procedure(s).   The patient's history has been reviewed, patient examined, no change in status, stable for endoscopic procedure(s).  I have reviewed the patient's chart and labs.  Questions were answered to the patient's satisfaction.     Ruble Buttler E. Candis Schatz, MD [facility]

## 2021-07-16 NOTE — Progress Notes (Signed)
Called to room to assist during endoscopic procedure.  Patient ID and intended procedure confirmed with present staff. Received instructions for my participation in the procedure from the performing physician.  

## 2021-07-16 NOTE — Op Note (Signed)
[place] Patient Name: Jackson Ayers Procedure Date: 07/16/2021 2:44 PM MRN: [id number] Endoscopist: Nicki Reaper E. Candis Schatz , MD Age: 40 Referring MD:  Date of Birth: 10-04-1980 Gender: Male Account #: [id number] Procedure:                Upper GI endoscopy Indications:              Dyspepsia, Esophageal reflux symptoms that persist                            despite appropriate therapy Medicines:                Monitored Anesthesia Care Procedure:                Pre-Anesthesia Assessment:                           - Prior to the procedure, a History and Physical                            was performed, and patient medications and                            allergies were reviewed. The patient's tolerance of                            previous anesthesia was also reviewed. The risks                            and benefits of the procedure and the sedation                            options and risks were discussed with the patient.                            All questions were answered, and informed consent                            was obtained. Prior Anticoagulants: The patient has                            taken no previous anticoagulant or antiplatelet                            agents. ASA Grade Assessment: II - A patient with                            mild systemic disease. After reviewing the risks                            and benefits, the patient was deemed in                            satisfactory condition to undergo the procedure.  After obtaining informed consent, the endoscope was                            passed under direct vision. Throughout the                            procedure, the patient's blood pressure, pulse, and                            oxygen saturations were monitored continuously. The                            Endoscope was introduced through the mouth, and                            advanced to the third  part of duodenum. The upper                            GI endoscopy was accomplished without difficulty.                            The patient tolerated the procedure well. Scope In: Scope Out: Findings:                 The examined portions of the nasopharynx,                            oropharynx and larynx were normal.                           LA Grade A (one or more mucosal breaks less than 5                            mm, not extending between tops of 2 mucosal folds)                            esophagitis with no bleeding was found.                           The exam of the esophagus was otherwise normal.                           The entire examined stomach was normal. Biopsies                            were taken with a cold forceps for Helicobacter                            pylori testing. Estimated blood loss was minimal.                           Scattered moderate inflammation characterized by  erythema was found in the duodenal bulb. Biopsies                            were taken with a cold forceps for histology.                            Estimated blood loss was minimal.                           The exam of the duodenum was otherwise normal. Complications:            No immediate complications. Estimated Blood Loss:     Estimated blood loss was minimal. Impression:               - The examined portions of the nasopharynx,                            oropharynx and larynx were normal.                           - LA Grade A reflux esophagitis with no bleeding.                           - Normal stomach. Biopsied.                           - Duodenitis. Biopsied. Recommendation:           - Patient has a contact number available for                            emergencies. The signs and symptoms of potential                            delayed complications were discussed with the                            patient. Return to normal activities  tomorrow.                            Written discharge instructions were provided to the                            patient.                           - Resume previous diet.                           - Continue present medications.                           - Await pathology results.                           - Use Protonix (pantoprazole) 40 mg PO daily for 4  weeks.                           - Follow up in clinic in 6-8 weeks Khayree Delellis E. Candis Schatz, MD 07/16/2021 3:09:55 PM This report has been signed electronically.

## 2021-07-16 NOTE — Progress Notes (Signed)
Report given to PACU, vss 

## 2021-07-16 NOTE — Progress Notes (Signed)
1343 Robinul 0.1 mg IV given due large amount of secretions upon assessment.  MD made aware, vss

## 2021-07-16 NOTE — Patient Instructions (Signed)
Resume previous diet and add Protonix 40 mg PO daily for 4 weeks.  Follow up in clinic in 6-8 weeks. Await pathology results. [facility] from [facility].   YOU HAD AN ENDOSCOPIC PROCEDURE TODAY AT [facility]:   Refer to the procedure report that was given to you for any specific questions about what was found during the examination.  If the procedure report does not answer your questions, please call your gastroenterologist to clarify.  If you requested that your care partner not be given the details of your procedure findings, then the procedure report has been included in a sealed envelope for you to review at your convenience later.  YOU SHOULD EXPECT: Some feelings of bloating in the abdomen. Passage of more gas than usual.  Walking can help get rid of the air that was put into your GI tract during the procedure and reduce the bloating. If you had a lower endoscopy (such as a colonoscopy or flexible sigmoidoscopy) you may notice spotting of blood in your stool or on the toilet paper. If you underwent a bowel prep for your procedure, you may not have a normal bowel movement for a few days.  Please Note:  You might notice some irritation and congestion in your nose or some drainage.  This is from the oxygen used during your procedure.  There is no need for concern and it should clear up in a day or so.  SYMPTOMS TO REPORT IMMEDIATELY:   Following upper endoscopy (EGD)  Vomiting of blood or coffee ground material  New chest pain or pain under the shoulder blades  Painful or persistently difficult swallowing  New shortness of breath  Fever of 100F or higher  Black, tarry-looking stools  For urgent or emergent issues, a gastroenterologist can be reached at any hour by calling [phone]. Do not use MyChart messaging for urgent concerns.    DIET:  We do recommend a small meal at first, but then you may proceed to your regular diet.  Drink plenty of fluids  but you should avoid alcoholic beverages for 24 hours.  ACTIVITY:  You should plan to take it easy for the rest of today and you should NOT DRIVE or use heavy machinery until tomorrow (because of the sedation medicines used during the test).    FOLLOW UP: Our staff will call the number listed on your records 48-72 hours following your procedure to check on you and address any questions or concerns that you may have regarding the information given to you following your procedure. If we do not reach you, we will leave a message.  We will attempt to reach you two times.  During this call, we will ask if you have developed any symptoms of COVID 19. If you develop any symptoms (ie: fever, flu-like symptoms, shortness of breath, cough etc.) before then, please call [phone].  If you test positive for Covid 19 in the 2 weeks post procedure, please call and report this information to [place].    If any biopsies were taken you will be contacted by phone or by letter within the next 1-3 weeks.  Please call us at [phone] if you have not heard about the biopsies in 3 weeks.    SIGNATURES/CONFIDENTIALITY: You and/or your care partner have signed paperwork which will be entered into your electronic medical record.  These signatures attest to the fact that that the information above on your After Visit Summary has been reviewed and  is understood.  Full responsibility of the confidentiality of this discharge information lies with you and/or your care-partner.

## 2021-07-16 NOTE — Progress Notes (Signed)
C.W. vital signs. 

## 2021-07-16 NOTE — Progress Notes (Signed)
Pt's states no medical or surgical changes since previsit or office visit. 

## 2021-07-18 ENCOUNTER — Telehealth: Payer: Self-pay | Admitting: *Deleted

## 2021-07-18 NOTE — Telephone Encounter (Signed)
First follow up call attempt.  LVM. 

## 2021-07-18 NOTE — Telephone Encounter (Signed)
  Follow up Call-  Call back number 07/16/2021  Post procedure Call Back phone  # [phone]  Permission to leave phone message Yes  Some recent data might be hidden     Patient questions:  Do you have a fever, pain , or abdominal swelling? No. Pain Score  0 *  Have you tolerated food without any problems? Yes.    Have you been able to return to your normal activities? Yes.    Do you have any questions about your discharge instructions: Diet   No. Medications  No. Follow up visit  No.  Do you have questions or concerns about your Care? No.  Actions: * If pain score is 4 or above: No action needed, pain <4.  Have you developed a fever since your procedure? no  2.   Have you had an respiratory symptoms (SOB or cough) since your procedure? no  3.   Have you tested positive for COVID 19 since your procedure no  4.   Have you had any family members/close contacts diagnosed with the COVID 19 since your procedure?  no   If yes to any of these questions please route to Joylene John, RN and Joella Prince, RN

## 2021-07-22 NOTE — Progress Notes (Signed)
Jackson Ayers,  The biopsies taken from your stomach were notable for mild chronic gastritis (inflammation) which is a common finding, but there was no evidence of Helicobacter pylori infection. This common finding is not likely to explain abdominal pain and there is no specific treatment or further evaluation recommended.   The biopsies taken from your duodenum showed peptic duodenitis as expected.  This is typically thought to be irritation of the duodenum related to exposure to stomach acid.  This may not necessarily be causing your symptoms, but I recommend taking the Protonix as discussed, as this should also help with the reflux esophagitis.  Please let me know if you are having problems with the Protonix and we can try something else.

## 2021-07-23 ENCOUNTER — Ambulatory Visit (HOSPITAL_COMMUNITY): Payer: 59

## 2021-08-02 ENCOUNTER — Ambulatory Visit (HOSPITAL_COMMUNITY)
Admission: RE | Admit: 2021-08-02 | Discharge: 2021-08-02 | Disposition: A | Discharge status: Home / Self Care | Payer: 59 | Source: Ambulatory Visit | Attending: Gastroenterology | Admitting: Gastroenterology

## 2021-08-02 ENCOUNTER — Other Ambulatory Visit: Payer: Self-pay

## 2021-08-02 DIAGNOSIS — R932 Abnormal findings on diagnostic imaging of liver and biliary tract: Secondary | ICD-10-CM | POA: Diagnosis present

## 2021-08-02 MED ORDER — GADOBUTROL 1 MMOL/ML IV SOLN
9.0000 mL | Freq: Once | INTRAVENOUS | Status: AC | PRN
  Administered 2021-08-02: 9 mL via INTRAVENOUS

## 2021-08-06 ENCOUNTER — Telehealth: Payer: Self-pay | Admitting: Gastroenterology

## 2021-08-06 NOTE — Telephone Encounter (Signed)
Please see note below and advise  

## 2021-08-06 NOTE — Telephone Encounter (Signed)
Inbound call from patient, requesting results from MRI done on 12/16. Please advise.

## 2021-08-07 NOTE — Progress Notes (Signed)
Arturo , Your MRI was very limited by motion artifact meaning that you were moving too much during the study, which caused the images to be blurry.  The radiologist has recommended a triphasic CT scan to further evaluate the liver lesion.  We will order that for your. 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  I defer to your PCM, but I don't think these typically require any further evaluation or surveillance.  Vaughan Basta,  Can you please order a Triphase CT of the liver for Mr. Pickel?

## 2021-08-08 ENCOUNTER — Other Ambulatory Visit: Payer: Self-pay

## 2021-08-08 ENCOUNTER — Telehealth: Payer: Self-pay

## 2021-08-08 DIAGNOSIS — K769 Liver disease, unspecified: Secondary | ICD-10-CM

## 2021-08-08 NOTE — Telephone Encounter (Signed)
This note is from Radiology regarding the MRI. I will cancel the new order so he will not be charged.   We do not need another order. We are attaching his images to his old order and the radiologist will make an addendum, We have him coming back on Sat at [place] will need see it on Epic but it has been arranged with the pt.  If a new order is placed he will be charged twice and we do not want this to happen.

## 2021-08-14 NOTE — Progress Notes (Signed)
Blandon,  The MRI was normal.  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  No follow up imaging or surveillance is needed.

## 2021-08-16 ENCOUNTER — Ambulatory Visit (HOSPITAL_COMMUNITY): Admission: RE | Admit: 2021-08-16 | Payer: 59 | Source: Ambulatory Visit

## 2021-08-27 ENCOUNTER — Encounter: Payer: Self-pay | Admitting: Internal Medicine

## 2021-08-27 ENCOUNTER — Other Ambulatory Visit: Payer: Self-pay

## 2021-08-27 ENCOUNTER — Ambulatory Visit (INDEPENDENT_AMBULATORY_CARE_PROVIDER_SITE_OTHER): Payer: 59 | Admitting: Internal Medicine

## 2021-08-27 VITALS — BP 102/60 | HR 83 | Temp 97.7°F | Ht 70.0 in | Wt 207.0 lb

## 2021-08-27 DIAGNOSIS — K760 Fatty (change of) liver, not elsewhere classified: Secondary | ICD-10-CM | POA: Diagnosis not present

## 2021-08-27 DIAGNOSIS — D3502 Benign neoplasm of left adrenal gland: Secondary | ICD-10-CM | POA: Diagnosis not present

## 2021-08-27 LAB — RENAL FUNCTION PANEL
Albumin: 4.6 g/dL (ref 3.5–5.2)
BUN: 10 mg/dL (ref 6–23)
CO2: 29 mEq/L (ref 19–32)
Calcium: 9.8 mg/dL (ref 8.4–10.5)
Chloride: 101 mEq/L (ref 96–112)
Creatinine, Ser: 0.84 mg/dL (ref 0.40–1.50)
GFR: 110.18 mL/min (ref >60.00)
Glucose, Bld: 97 mg/dL (ref 70–99)
Phosphorus: 3.5 mg/dL (ref 2.3–4.6)
Potassium: 4 mEq/L (ref 3.5–5.1)
Sodium: 137 mEq/L (ref 135–145)

## 2021-08-27 LAB — CORTISOL: Cortisol, Plasma: 14.1 ug/dL

## 2021-08-27 MED ORDER — DEXAMETHASONE 1 MG PO TABS: 1.0000 mg | ORAL_TABLET | Freq: Once | ORAL | 0 refills | Status: AC

## 2021-08-27 NOTE — Progress Notes (Signed)
° °  Subjective:    Patient ID: Jackson Ayers, male    DOB: [birth year], 41 y.o.   MRN: [id number]  HPI Here to review incidental findings from abdominal MRI  Has 2cm adrenal adenoma No clear symptoms  No headaches except some sinus BP runs low No sexual problems  Current Outpatient Medications on File Prior to Visit  Medication Sig Dispense Refill   levothyroxine (SYNTHROID) 175 MCG tablet TAKE 1 TABLET BY MOUTH ONCE DAILY ON AN EMPTY STOMACH 90 tablet 3   pantoprazole (PROTONIX) 40 MG tablet Take 1 tablet (40 mg total) by mouth daily. 30 tablet 0   No current facility-administered medications on file prior to visit.    No Known Allergies  Past Medical History:  Diagnosis Date   Thyroid disease     History reviewed. No pertinent surgical history.  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  Tobacco Use   Smoking status: Never   Smokeless tobacco: Never  Vaping Use   Vaping Use: Never used  Substance and Sexual Activity   Alcohol use: Yes    Alcohol/week: 0.0 standard drinks   Drug use: No   Sexual activity: Not on file  Other Topics Concern   Not on file  Social History Narrative       Social Determinants of Health   Financial Resource Strain: Not on file  Food Insecurity: Not on file  Transportation Needs: Not on file  Physical Activity: Not on file  Stress: Not on file  Social Connections: Not on file  Intimate Partner Violence: Not on file   Review of Systems Eating better Has lost 15# or so--overall     Objective:   Physical Exam Constitutional:      Appearance: Normal appearance.  Neurological:      Mental Status: He is alert.  Psychiatric:        Mood and Affect: Mood normal.        Behavior: Behavior normal.           Assessment & Plan:

## 2021-08-27 NOTE — Assessment & Plan Note (Signed)
Discussed that most of these are incidental No clear symptoms to suggest Cushing's or hyperald or sexualization Will check hormonal work up to make sure non functioning

## 2021-08-27 NOTE — Patient Instructions (Signed)
You will be doing a 24 hour urine collection. You should also take the 1mg  dexamethasone at bedtime --the night before you are coming in for a fasting cortisol level.

## 2021-08-27 NOTE — Assessment & Plan Note (Signed)
Is working on eating better Has lost some weight

## 2021-08-30 ENCOUNTER — Other Ambulatory Visit: Payer: 59

## 2021-09-02 LAB — ANDROSTENEDIONE: Androstenedione: 133 ng/dL (ref 40–190)

## 2021-09-02 LAB — ESTRADIOL: Estradiol: 23 pg/mL (ref <=39)

## 2021-09-02 LAB — DHEA: DHEA: 575 ng/dL (ref 147–1760)

## 2021-09-02 LAB — 17-HYDROXYPROGESTERONE: 17-OH-Progesterone, LC/MS/MS: 51 ng/dL (ref 42–196)

## 2021-09-02 LAB — ACTH: C206 ACTH: 24 pg/mL (ref 6–50)

## 2021-09-03 ENCOUNTER — Encounter: Payer: Self-pay | Admitting: Gastroenterology

## 2021-09-03 ENCOUNTER — Ambulatory Visit (INDEPENDENT_AMBULATORY_CARE_PROVIDER_SITE_OTHER): Payer: 59 | Admitting: Gastroenterology

## 2021-09-03 VITALS — BP 98/68 | HR 62 | Ht 70.0 in | Wt 212.0 lb

## 2021-09-03 DIAGNOSIS — K760 Fatty (change of) liver, not elsewhere classified: Secondary | ICD-10-CM

## 2021-09-03 DIAGNOSIS — K2100 Gastro-esophageal reflux disease with esophagitis, without bleeding: Secondary | ICD-10-CM

## 2021-09-03 LAB — CORTISOL, FREE AND CORTISONE, 24 HOUR URINE W/CREATININE
24 Hour urine volume (VMAHVA): 550 mL
CREATININE, URINE: 1.11 g/(24.h) (ref 0.50–2.15)
Cortisol (Ur), Free: 16.2 mcg/24 h (ref 4.0–50.0)
Cortisone, 24H Ur: 40.4 mcg/24 h (ref 23–195)

## 2021-09-03 NOTE — Progress Notes (Signed)
° ° °  History of Present Illness:  Jackson Ayers is a very pleasant 41 year old male who I have seen previously for atypical GERD symptoms and dyspepsia as well as nonalcoholic fatty liver disease.  He underwent an upper endoscopy November 29 which was notable for mild reflux esophagitis and duodenal erythema.  He was started on Protonix (previously had side effects from taking omeprazole).  He states that he took the Protonix for a few weeks and then stopped.  He has made significant lifestyle changes which has resulted in 20 pounds of weight loss since September.  He has eliminated soda and milk and is increased consumption of salads and fruits.  Significantly cut out sugary foods.  Since making these dietary changes and losing weight, he states that his upper GI symptoms have resolved.  He no longer has any problems with cough or throat irritation.  Denies problems with bloating or dyspepsia.   Current Medications, Allergies, Past Medical History, Past Surgical History, Family History and Social History were reviewed in [employer] record.  Studies:   EGD with LA Grade A esophagitis, duodenal erythema   Physical Exam: General: Pleasant, well developed , male in no acute distress, accompanied by father Head: Normocephalic and atraumatic Eyes:  sclerae anicteric, conjunctiva pink  Abdomen: Soft, non distended, non-tender. No masses, no hepatomegaly. Normal bowel sounds Musculoskeletal: Symmetrical with no gross deformities  Extremities: No edema  Neurological: Alert oriented x 4, grossly nonfocal Psychological:  Alert and cooperative. Normal mood and affect  Assessment and Recommendations: 41 year old male with bothersome atypical GERD symptoms, found to have mild reflux esophagitis on upper endoscopy, now with complete resolution of his GERD symptoms with weight loss and dietary modifications.  Although he discontinued his Protonix early, I think it is okay for him  not to complete the full course given his complete resolution of symptoms off medications.  I praised the patient for making these lifestyle changes and encouraged him to keep them up indefinitely, as these lifestyle changes will not only help his GERD symptoms, but will also potentially reverse the fatty liver previously noted.  I suggested we repeat an ultrasound in 3 years to see if the steatosis has resolved.  He can follow-up as needed for any recurrence of symptoms.  GERD - resolved with diet modification/weight loss  NAFLD - Repeat US in 3 years - Repeat liver enzymes periodically, can be done through PCP  Magan Winnett E. Candis Schatz, MD [facility]

## 2021-09-03 NOTE — Patient Instructions (Signed)
If you are age 40 or older, your body mass index should be between 23-30. Your Body mass index is 30.42 kg/m. If this is out of the aforementioned range listed, please consider follow up with your Primary Care Provider.  If you are age 23 or younger, your body mass index should be between 19-25. Your Body mass index is 30.42 kg/m. If this is out of the aformentioned range listed, please consider follow up with your Primary Care Provider.   Follow up Ultrasound in 3 years for RUQ ultrasound.    The [facility] providers would like to encourage you to use [facility] to communicate with providers for non-urgent requests or questions.  Due to long hold times on the telephone, sending your provider a message by [facility] may be a faster and more efficient way to get a response.  Please allow 48 business hours for a response.  Please remember that this is for non-urgent requests.   It was a pleasure to see you today!  Thank you for trusting me with your gastrointestinal care!    Scott E.Candis Schatz, MD

## 2021-09-04 ENCOUNTER — Encounter: Payer: Self-pay | Admitting: Gastroenterology

## 2021-09-10 ENCOUNTER — Other Ambulatory Visit: Payer: Self-pay

## 2021-09-10 ENCOUNTER — Other Ambulatory Visit (INDEPENDENT_AMBULATORY_CARE_PROVIDER_SITE_OTHER): Payer: 59

## 2021-09-10 DIAGNOSIS — D3502 Benign neoplasm of left adrenal gland: Secondary | ICD-10-CM

## 2021-09-11 LAB — CORTISOL-AM, BLOOD: Cortisol - AM: 1.3 ug/dL — ABNORMAL LOW

## 2021-11-03 ENCOUNTER — Other Ambulatory Visit: Payer: Self-pay | Admitting: Internal Medicine

## 2021-11-05 ENCOUNTER — Ambulatory Visit (INDEPENDENT_AMBULATORY_CARE_PROVIDER_SITE_OTHER): Payer: 59 | Admitting: Internal Medicine

## 2021-11-05 ENCOUNTER — Other Ambulatory Visit: Payer: Self-pay

## 2021-11-05 ENCOUNTER — Encounter: Payer: Self-pay | Admitting: Internal Medicine

## 2021-11-05 VITALS — BP 102/60 | HR 70 | Temp 97.9°F | Ht 71.5 in | Wt 212.0 lb

## 2021-11-05 DIAGNOSIS — K2100 Gastro-esophageal reflux disease with esophagitis, without bleeding: Secondary | ICD-10-CM

## 2021-11-05 DIAGNOSIS — Z0000 Encounter for general adult medical examination without abnormal findings: Secondary | ICD-10-CM

## 2021-11-05 DIAGNOSIS — E039 Hypothyroidism, unspecified: Secondary | ICD-10-CM

## 2021-11-05 LAB — TSH: TSH: 3.21 u[IU]/mL (ref 0.35–5.50)

## 2021-11-05 LAB — T4, FREE: Free T4: 1.17 ng/dL (ref 0.60–1.60)

## 2021-11-05 MED ORDER — LEVOTHYROXINE SODIUM 175 MCG PO TABS: ORAL_TABLET | ORAL | 3 refills | Status: DC

## 2021-11-05 NOTE — Assessment & Plan Note (Signed)
Still has some symptoms==but doing okay without Rx ?

## 2021-11-05 NOTE — Assessment & Plan Note (Signed)
Healthy ?Discussed fitness ?Consider COVID booster in the fall ?Flu vaccine in the fall ?

## 2021-11-05 NOTE — Progress Notes (Signed)
? ?Subjective:  ? ? Patient ID: Jackson Ayers, male    DOB: [birth year], 41 y.o.   MRN: [id number] ? ?HPI ?Here for physical ? ?Reviewed negative testing for adrenal adenoma ? ?Fairly consistent with levothyroxine ?Energy is okay ?Exercising regularly ? ?Current Outpatient Medications on File Prior to Visit  ?Medication Sig Dispense Refill  ? levothyroxine (SYNTHROID) 175 MCG tablet TAKE 1 TABLET BY MOUTH ONCE DAILY ON AN EMPTY STOMACH 90 tablet 3  ? ?No current facility-administered medications on file prior to visit.  ? ? ?No Known Allergies ? ?Past Medical History:  ?Diagnosis Date  ? Thyroid disease   ? ? ?History reviewed. No pertinent surgical history. ? ?Family History  ?Problem Relation Age of Onset  ? Diabetes Mother   ? Hypertension Father   ? ? ?Social History  ? ?Socioeconomic History  ? Marital status: Single  ?  Spouse name: Not on file  ? Number of children: Not on file  ? Years of education: Not on file  ? Highest education level: Not on file  ?Occupational History  ? Occupation: Freight forwarder  ?  Employer: [employer]  ?  Comment: Dad[employer] stores  ?Tobacco Use  ? Smoking status: Never  ?  Passive exposure: Never  ? Smokeless tobacco: Never  ?Vaping Use  ? Vaping Use: Never used  ?Substance and Sexual Activity  ? Alcohol use: Yes  ?  Alcohol/week: 0.0 standard drinks  ? Drug use: No  ? Sexual activity: Not on file  ?Other Topics Concern  ? Not on file  ?Social History Narrative  ?    ? ?Social Determinants of Health  ? ?Financial Resource Strain: Not on file  ?Food Insecurity: Not on file  ?Transportation Needs: Not on file  ?Physical Activity: Not on file  ?Stress: Not on file  ?Social Connections: Not on file  ?Intimate Partner Violence: Not on file  ? ?Review of Systems  ?Constitutional:  Negative for fatigue and unexpected weight change.  ?     Wears seat belt  ?HENT:  Negative for dental problem and hearing loss.   ?     Rare brief tinnitus  ?Eyes:  Negative for visual disturbance.  ?Respiratory:  Negative  for cough, chest tightness and shortness of breath.   ?Cardiovascular:  Negative for chest pain, palpitations and leg swelling.  ?Gastrointestinal:   ?     Will notice stomach constricting at times--if he "doesn't have enough acid" ?No heartburn now ?Only swallowing issues are with nasal drainage ?Still bloats at times ? ?Does use lactase for milk ?Bowels regular with a probiotic  ?Endocrine: Negative for polydipsia and polyuria.  ?Genitourinary:  Negative for difficulty urinating and urgency.  ?     No sexual problems  ?Musculoskeletal:  Negative for arthralgias and joint swelling.  ?     Occ AM back soreness--if he sleeps "funny"  ?Skin:  Negative for rash.  ?     No skin or nail changes  ?Allergic/Immunologic: Positive for environmental allergies. Negative for immunocompromised state.  ?Neurological:  Negative for dizziness, syncope and light-headedness.  ?     Rare headaches  ?Hematological:  Negative for adenopathy. Does not bruise/bleed easily.  ?Psychiatric/Behavioral:  Negative for dysphoric mood and sleep disturbance.   ?     Mild anxiety--better with music/video games  ? ?   ?Objective:  ? Physical Exam ?Constitutional:   ?   Appearance: Normal appearance.  ?HENT:  ?   Mouth/Throat:  ?   Pharynx: No oropharyngeal  exudate or posterior oropharyngeal erythema.  ?Eyes:  ?   Conjunctiva/sclera: Conjunctivae normal.  ?   Pupils: Pupils are equal, round, and reactive to light.  ?Cardiovascular:  ?   Rate and Rhythm: Normal rate and regular rhythm.  ?   Pulses: Normal pulses.  ?   Heart sounds: No murmur heard. ?  No gallop.  ?Pulmonary:  ?   Effort: Pulmonary effort is normal.  ?   Breath sounds: Normal breath sounds. No wheezing or rales.  ?Abdominal:  ?   Palpations: Abdomen is soft.  ?   Tenderness: There is no abdominal tenderness.  ?Musculoskeletal:  ?   Cervical back: Neck supple.  ?   Right lower leg: No edema.  ?   Left lower leg: No edema.  ?Lymphadenopathy:  ?   Cervical: No cervical adenopathy.   ?Skin: ?   Findings: No lesion or rash.  ?Neurological:  ?   General: No focal deficit present.  ?   Mental Status: He is alert and oriented to person, place, and time.  ?Psychiatric:     ?   Mood and Affect: Mood normal.     ?   Behavior: Behavior normal.  ?  ? ? ? ? ?   ?Assessment & Plan:  ? ?

## 2021-11-05 NOTE — Assessment & Plan Note (Signed)
Seems to be euthyroid on 175 mcg levothyroxine ?Will check labs ?

## 2022-11-07 ENCOUNTER — Encounter: Payer: 59 | Admitting: Internal Medicine

## 2023-04-28 ENCOUNTER — Other Ambulatory Visit: Payer: Self-pay | Admitting: Internal Medicine

## 2023-08-17 ENCOUNTER — Other Ambulatory Visit: Payer: Self-pay | Admitting: Internal Medicine

## 2023-08-31 ENCOUNTER — Other Ambulatory Visit: Payer: BLUE CROSS/BLUE SHIELD

## 2023-09-07 ENCOUNTER — Ambulatory Visit: Payer: BLUE CROSS/BLUE SHIELD | Admitting: Internal Medicine

## 2023-09-08 ENCOUNTER — Encounter: Payer: Self-pay | Admitting: Internal Medicine

## 2023-09-08 ENCOUNTER — Ambulatory Visit: Payer: Self-pay | Admitting: Internal Medicine

## 2023-09-08 VITALS — BP 102/70 | HR 84 | Temp 97.7°F | Ht 71.0 in | Wt 235.0 lb

## 2023-09-08 DIAGNOSIS — E039 Hypothyroidism, unspecified: Secondary | ICD-10-CM | POA: Diagnosis not present

## 2023-09-08 DIAGNOSIS — F39 Unspecified mood [affective] disorder: Secondary | ICD-10-CM

## 2023-09-08 DIAGNOSIS — K219 Gastro-esophageal reflux disease without esophagitis: Secondary | ICD-10-CM | POA: Diagnosis not present

## 2023-09-08 LAB — COMPREHENSIVE METABOLIC PANEL
ALT: 57 U/L — ABNORMAL HIGH (ref 0–53)
AST: 25 U/L (ref 0–37)
Albumin: 4.6 g/dL (ref 3.5–5.2)
Alkaline Phosphatase: 88 U/L (ref 39–117)
BUN: 13 mg/dL (ref 6–23)
CO2: 29 meq/L (ref 19–32)
Calcium: 9.7 mg/dL (ref 8.4–10.5)
Chloride: 103 meq/L (ref 96–112)
Creatinine, Ser: 0.87 mg/dL (ref 0.40–1.50)
GFR: 107.47 mL/min (ref >60.00)
Glucose, Bld: 102 mg/dL — ABNORMAL HIGH (ref 70–99)
Potassium: 4.3 meq/L (ref 3.5–5.1)
Sodium: 139 meq/L (ref 135–145)
Total Bilirubin: 0.6 mg/dL (ref 0.2–1.2)
Total Protein: 7.3 g/dL (ref 6.0–8.3)

## 2023-09-08 LAB — CBC
HCT: 48.3 % (ref 39.0–52.0)
Hemoglobin: 16.3 g/dL (ref 13.0–17.0)
MCHC: 33.7 g/dL (ref 30.0–36.0)
MCV: 84.1 fL (ref 78.0–100.0)
Platelets: 268 10*3/uL (ref 150.0–400.0)
RBC: 5.74 Mil/uL (ref 4.22–5.81)
RDW: 13.5 % (ref 11.5–15.5)
WBC: 5.2 10*3/uL (ref 4.0–10.5)

## 2023-09-08 LAB — T4, FREE: Free T4: 1.69 ng/dL — ABNORMAL HIGH (ref 0.60–1.60)

## 2023-09-08 LAB — TSH: TSH: 3.45 u[IU]/mL (ref 0.35–5.50)

## 2023-09-08 MED ORDER — FLUOXETINE HCL 20 MG PO CAPS: 20.0000 mg | ORAL_CAPSULE | Freq: Every day | ORAL | 3 refills | Status: DC

## 2023-09-08 MED ORDER — LEVOTHYROXINE SODIUM 175 MCG PO TABS: ORAL_TABLET | ORAL | 3 refills | Status: DC

## 2023-09-08 NOTE — Progress Notes (Signed)
Subjective:    Patient ID: Jackson Ayers, male    DOB: [birth year], 43 y.o.   MRN: [id number]  HPI Here for follow up of hypothyroidism and other medical conditions With dad  Saw another physician at [facility] (some months ago) Jackson Ayers there since we weren't on the insurance he had via the exchange Did have thyroid levels low--was told it was low (wondered about compliance) He is not sure he missed any doses---but is clear that he has taken it regularly in the past 6 weeks  Feels okay Same job  Now regular in gym--has gained weight (doing lots of resistance and cardio)  Still has mood swings per dad Can be worrisome/threatening----mostly with family He relates this to stress  Current Outpatient Medications on File Prior to Visit  Medication Sig Dispense Refill   levothyroxine (SYNTHROID) 175 MCG tablet TAKE 1 TABLET BY MOUTH ONCE DAILY ON AN EMPTY STOMACH 90 tablet 3   No current facility-administered medications on file prior to visit.    No Known Allergies  Past Medical History:  Diagnosis Date   Thyroid disease     History reviewed. No pertinent surgical history.  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  Tobacco Use   Smoking status: Never    Passive exposure: Never   Smokeless tobacco: Never  Vaping Use   Vaping status: Never Used  Substance and Sexual Activity   Alcohol use: Yes    Alcohol/week: 0.0 standard drinks of alcohol   Drug use: No   Sexual activity: Not on file  Other Topics Concern   Not on file  Social History Narrative       Social Drivers of Health   Financial Resource Strain: Not on  file  Food Insecurity: Not on file  Transportation Needs: Not on file  Physical Activity: Not on file  Stress: Not on file  Social Connections: Not on file  Intimate Partner Violence: Not on file   Review of [employer] levels are okay Generally sleeps okay Bad snoring---but no sig daytime somnolence (dad states no clear apnea). Nose strips do help some Eating fairly well No chest pain  No SOB other than with sinus congestion (no trouble in the gym) No recent acid reflux problems-hasn't needed meds     Objective:   Physical Exam Constitutional:      Appearance: Normal appearance.  HENT:     Mouth/Throat:     Pharynx: No oropharyngeal exudate or posterior oropharyngeal erythema.  Cardiovascular:     Rate and Rhythm: Normal rate and regular rhythm.     Pulses: Normal pulses.     Heart sounds: No murmur heard.    No gallop.  Pulmonary:     Effort: Pulmonary effort is normal.     Breath sounds: Normal breath sounds. No wheezing or rales.  Abdominal:     Palpations: Abdomen is soft.     Tenderness: There is no abdominal tenderness.  Musculoskeletal:     Cervical back: Neck supple.     Right lower leg: No edema.  Left lower leg: No edema.  Lymphadenopathy:     Cervical: No cervical adenopathy.  Neurological:     Mental Status: He is alert.  Psychiatric:        Mood and Affect: Mood normal.        Behavior: Behavior normal.            Assessment & Plan:

## 2023-09-08 NOTE — Assessment & Plan Note (Signed)
Still has easy anger Dad concerned it is a real issue Discussed options---will try fluoxetine 20mg  daily

## 2023-09-08 NOTE — Assessment & Plan Note (Signed)
Has been better Generally not needing medication

## 2023-09-08 NOTE — Assessment & Plan Note (Signed)
Seems to be euthyroid Will check his labs on the levothyroxine 175 mcg

## 2023-09-08 NOTE — Patient Instructions (Signed)
Please start the fluoxetine at 20mg  every other day for 2 weeks---and if no problems, then increase to 20mg  every day (won't be a problem if you occasionally forget it)

## 2023-09-09 ENCOUNTER — Encounter: Payer: Self-pay | Admitting: Internal Medicine

## 2023-11-12 ENCOUNTER — Ambulatory Visit: Payer: No Typology Code available for payment source | Admitting: Internal Medicine

## 2024-06-02 ENCOUNTER — Ambulatory Visit (INDEPENDENT_AMBULATORY_CARE_PROVIDER_SITE_OTHER): Admitting: Nurse Practitioner

## 2024-06-02 ENCOUNTER — Ambulatory Visit: Payer: Self-pay

## 2024-06-02 ENCOUNTER — Ambulatory Visit (INDEPENDENT_AMBULATORY_CARE_PROVIDER_SITE_OTHER)
Admission: RE | Admit: 2024-06-02 | Discharge: 2024-06-02 | Disposition: A | Discharge status: Home / Self Care | Source: Ambulatory Visit | Attending: Nurse Practitioner | Admitting: Nurse Practitioner

## 2024-06-02 VITALS — BP 120/72 | HR 85 | Temp 98.1°F | Ht 71.0 in | Wt 248.8 lb

## 2024-06-02 DIAGNOSIS — T148XXA Other injury of unspecified body region, initial encounter: Secondary | ICD-10-CM

## 2024-06-02 DIAGNOSIS — R079 Chest pain, unspecified: Secondary | ICD-10-CM

## 2024-06-02 DIAGNOSIS — R1011 Right upper quadrant pain: Secondary | ICD-10-CM

## 2024-06-02 MED ORDER — CYCLOBENZAPRINE HCL 5 MG PO TABS: 5.0000 mg | ORAL_TABLET | Freq: Two times a day (BID) | ORAL | 0 refills | Status: DC | PRN

## 2024-06-02 NOTE — Telephone Encounter (Signed)
 FYI Only or Action Required?: FYI only for provider.  Patient was last seen in primary care on 09/08/2023 by Jackson Charlie FERNS, MD.  Called Nurse Triage reporting Pain.  Symptoms began a week ago.  Interventions attempted: Nothing.  Symptoms are: unchanged.  Triage Disposition: See Physician Within 24 Hours  Patient/caregiver understands and will follow disposition?: Yes  Copied from CRM #[id number]. Topic: Clinical - Red Word Triage >> Jun 02, 2024  2:23 PM Drema MATSU wrote: Red Word that prompted transfer to Nurse Triage: Patient has pain on right side and he was throwing away a baseboard when the pole of it hit his side. He said that he is bruised internally. Reason for Disposition  [1] Chest pain lasts > 5 minutes AND [2] occurred > 3 days ago (72 hours) AND [3] NO chest pain or cardiac symptoms now  Answer Assessment - Initial Assessment Questions No available appts with pcp, scheduled with alternative provider, 06/02/24.  Advised UC/ED if symptoms worsen.  Denies n/v/d, dizziness, faint, bruising, bleeding, diff breathing weakness, numbness. Last BM yesterday,normal, brown.  1. LOCATION: Where does it hurt?       Right side of ribs,  2. RADIATION: Does the pain go anywhere else? (e.g., into neck, jaw, arms, back)     Ride sided pain only, denies chest pain or abd pain 3. ONSET: When did the chest pain begin? (Minutes, hours or days)      Week ago; hit side with pole; no puncture, cuts, bruising or bleeding. 4. PATTERN: Does the pain come and go, or has it been constant since it started?  Does it get worse with exertion?      constant 6. SEVERITY: How bad is the pain?  (e.g., Scale 1-10; mild, moderate, or severe)     7/10; no pain 7. CARDIAC RISK FACTORS: Do you have any history of heart problems or risk factors for heart disease? (e.g., angina, prior heart attack; diabetes, high blood pressure, high cholesterol, smoker, or strong family history of heart  disease)     no 8. PULMONARY RISK FACTORS: Do you have any history of lung disease?  (e.g., blood clots in lung, asthma, emphysema, birth control pills)     no 9. CAUSE: What do you think is causing the chest pain?     Pole hit side 10. OTHER SYMPTOMS: Do you have any other symptoms? (e.g., dizziness, nausea, vomiting, sweating, fever, difficulty breathing, cough)   Bloating abdomen, abdomen discomfort,  Protocols used: Chest Pain-A-AH

## 2024-06-02 NOTE — Progress Notes (Signed)
 Established Patient Office Visit  Subjective   Patient ID: Add Dinapoli, male    DOB: [birth year]  Age: 43 y.o. MRN: [id number]  Chief Complaint  Patient presents with   Pain    Pt complains of lower right side pain that started two weeks ago after carrying large pole. Pt states when coughing or sneezing pain worsens. Has pain/pressure when urinating.     HPI  Discussed the use of AI scribe software for clinical note transcription with the patient, who gave verbal consent to proceed.  History of Present Illness Erric Machnik is a 43 year old male who presents with right-sided abdominal pain after being hit by a pole two weeks ago.  He has been experiencing right-sided abdominal pain since being struck by a pole while helping his father clean up his house two weeks ago. The pain extends from the front to the back of his right side and is exacerbated by coughing, sneezing, bending, and sleeping on his side. Bloating increases the pain, described as a 'pulling' sensation when his stomach is bloated.  He uses Salonpas patches for temporary relief due to their numbing effect, but has not used other medications like Tylenol or ibuprofen. The patches provide relief for about eight hours. The pain is particularly noticeable when getting out of bed, described as sharp at times, and he has difficulty getting out of bed, needing to 'tread his whole body' to roll out. The pain is less noticeable during the day when active but worsens when preparing to go to bed.  He denies issues with urination but reports discomfort during bowel movements, feeling 'too much back pressure' and pain in the abdominal area when defecating. He uses prune juice to alleviate constipation, which he feels helps to 'flush it out'. No nausea or vomiting related to this incident.  He is currently taking Synthroid  (levothyroxine ) and fluoxetine  (Prozac ) as part of his regular medication regimen.     Review of Systems   Respiratory:  Negative for shortness of breath.   Cardiovascular:  Positive for chest pain (MSK).  Gastrointestinal:  Positive for abdominal pain. Negative for nausea and vomiting.  Genitourinary:  Negative for dysuria, frequency, hematuria and urgency.      Objective:     BP 120/72   Pulse 85   Temp 98.1 F (36.7 C) (Oral)   Ht 5' 11 (1.803 m)   Wt 248 lb 12.8 oz (112.9 kg)   SpO2 97%   BMI 34.70 kg/m  BP Readings from Last 3 Encounters:  06/02/24 120/72  09/08/23 102/70  11/05/21 102/60   Wt Readings from Last 3 Encounters:  06/02/24 248 lb 12.8 oz (112.9 kg)  09/08/23 235 lb (106.6 kg)  11/05/21 212 lb (96.2 kg)   SpO2 Readings from Last 3 Encounters:  06/02/24 97%  09/08/23 96%  11/05/21 98%      Physical Exam Vitals and nursing note reviewed.  Constitutional:      Appearance: Normal appearance.  Cardiovascular:     Rate and Rhythm: Normal rate and regular rhythm.     Heart sounds: Normal heart sounds.  Pulmonary:     Effort: Pulmonary effort is normal.     Breath sounds: Normal breath sounds.  Chest:     Chest wall: Tenderness present.  Abdominal:     General: Bowel sounds are normal.     Tenderness: There is abdominal tenderness.  Musculoskeletal:     Comments: Worse with lateral slide movements Worse with lumbar extension  Neurological:     Mental Status: He is alert.      No results found for any visits on 06/02/24.    The ASCVD Risk score (Arnett DK, et al., 2019) failed to calculate for the following reasons:   Cannot find a previous HDL lab   Cannot find a previous total cholesterol lab    Assessment & Plan:   Problem List Items Addressed This Visit   None Visit Diagnoses       Right-sided chest pain    -  Primary   Relevant Orders   DG Ribs Unilateral W/Chest Right     Muscle strain       Relevant Medications   cyclobenzaprine (FLEXERIL) 5 MG tablet     Right upper quadrant abdominal pain       Relevant Orders    Comprehensive metabolic panel with GFR   Urinalysis w microscopic + reflex cultur     Assessment and Plan Assessment & Plan Right-sided abdominal and flank muscle strain Persistent right-sided abdominal and flank pain post-trauma, exacerbated by movement. No visible bruising, tenderness present. - Order chest x-ray to assess for bone damage and blood work to access liver condition. - Prescribe Flexeril (cyclobenzaprine) up to twice daily as needed, caution for drowsiness. - Advise Salonpas patches for daytime pain relief. - Instruct to take Flexeril before bed for nighttime pain. - Obtain urine sample for evaluation.  Constipation Functional constipation with abdominal discomfort and bloating, managed with prune juice.   Return if symptoms worsen or fail to improve, for as scheduled .    Adina Crandall, NP

## 2024-06-02 NOTE — Telephone Encounter (Signed)
 Noted. Will evaluate in office

## 2024-06-02 NOTE — Patient Instructions (Signed)
 Nice to see you today  You can continue using the salon pas patch I have sent in a muscle relaxer pill that can cause sedation. Best taken before bed Follow up if you do not improve

## 2024-06-03 ENCOUNTER — Telehealth: Payer: Self-pay | Admitting: Internal Medicine

## 2024-06-03 LAB — COMPREHENSIVE METABOLIC PANEL WITH GFR
ALT: 74 U/L — ABNORMAL HIGH (ref 0–53)
AST: 32 U/L (ref 0–37)
Albumin: 4.8 g/dL (ref 3.5–5.2)
Alkaline Phosphatase: 92 U/L (ref 39–117)
BUN: 11 mg/dL (ref 6–23)
CO2: 29 meq/L (ref 19–32)
Calcium: 9.2 mg/dL (ref 8.4–10.5)
Chloride: 102 meq/L (ref 96–112)
Creatinine, Ser: 1.1 mg/dL (ref 0.40–1.50)
GFR: 83.18 mL/min (ref >60.00)
Glucose, Bld: 98 mg/dL (ref 70–99)
Potassium: 3.6 meq/L (ref 3.5–5.1)
Sodium: 139 meq/L (ref 135–145)
Total Bilirubin: 0.5 mg/dL (ref 0.2–1.2)
Total Protein: 7.6 g/dL (ref 6.0–8.3)

## 2024-06-03 LAB — URINALYSIS W MICROSCOPIC + REFLEX CULTURE
Bacteria, UA: NONE SEEN /HPF
Bilirubin Urine: NEGATIVE
Glucose, UA: NEGATIVE
Hgb urine dipstick: NEGATIVE
Hyaline Cast: NONE SEEN /LPF
Ketones, ur: NEGATIVE
Leukocyte Esterase: NEGATIVE
Nitrites, Initial: NEGATIVE
Protein, ur: NEGATIVE
RBC / HPF: NONE SEEN /HPF (ref 0–2)
Specific Gravity, Urine: 1.026 (ref 1.001–1.035)
Squamous Epithelial / HPF: NONE SEEN /HPF (ref <=5)
WBC, UA: NONE SEEN /HPF (ref 0–5)
pH: 5.5 (ref 5.0–8.0)

## 2024-06-03 LAB — NO CULTURE INDICATED

## 2024-06-03 NOTE — Telephone Encounter (Signed)
 On call  note from service patient  concern about x ray report  not sure  what it means. Reviewed with nurse  no alarming   findings or fracture  . Can discuss with pcp   later if needed .

## 2024-06-06 ENCOUNTER — Ambulatory Visit: Payer: Self-pay | Admitting: Nurse Practitioner

## 2024-06-06 NOTE — Telephone Encounter (Signed)
 SABRA

## 2024-06-06 NOTE — Telephone Encounter (Signed)
 Sending note to CHRISTELLA Crandall NP and [employer]

## 2024-06-06 NOTE — Telephone Encounter (Signed)
 See result note.

## 2024-06-06 NOTE — Telephone Encounter (Signed)
-----   Message from Laray CHRISTELLA Arenas, LPN sent at [phone]  8:12 AM EDT -----

## 2024-06-28 ENCOUNTER — Ambulatory Visit: Payer: Self-pay

## 2024-06-28 ENCOUNTER — Encounter: Payer: Self-pay | Admitting: Family

## 2024-06-28 ENCOUNTER — Ambulatory Visit: Admitting: Family

## 2024-06-28 ENCOUNTER — Ambulatory Visit: Payer: Self-pay | Admitting: Family

## 2024-06-28 VITALS — BP 116/78 | HR 96 | Temp 97.9°F | Ht 71.0 in | Wt 244.6 lb

## 2024-06-28 DIAGNOSIS — M5417 Radiculopathy, lumbosacral region: Secondary | ICD-10-CM | POA: Diagnosis not present

## 2024-06-28 LAB — COMPREHENSIVE METABOLIC PANEL WITH GFR
ALT: 71 U/L — ABNORMAL HIGH (ref 0–53)
AST: 31 U/L (ref 0–37)
Albumin: 4.9 g/dL (ref 3.5–5.2)
Alkaline Phosphatase: 83 U/L (ref 39–117)
BUN: 13 mg/dL (ref 6–23)
CO2: 29 meq/L (ref 19–32)
Calcium: 9.6 mg/dL (ref 8.4–10.5)
Chloride: 100 meq/L (ref 96–112)
Creatinine, Ser: 0.98 mg/dL (ref 0.40–1.50)
GFR: 95.5 mL/min (ref >60.00)
Glucose, Bld: 77 mg/dL (ref 70–99)
Potassium: 3.9 meq/L (ref 3.5–5.1)
Sodium: 140 meq/L (ref 135–145)
Total Bilirubin: 1 mg/dL (ref 0.2–1.2)
Total Protein: 7.6 g/dL (ref 6.0–8.3)

## 2024-06-28 MED ORDER — METHYLPREDNISOLONE 4 MG PO TBPK: ORAL_TABLET | ORAL | 0 refills | Status: DC

## 2024-06-28 NOTE — Progress Notes (Signed)
 Acute Office Visit  Subjective:     Patient ID: Jackson Ayers, male    DOB: April 06, 1981, 43 y.o.   MRN: [id number]  Chief Complaint  Patient presents with  . Back Pain    Lower back radiating to ride side; been on and off for a while; feels like a pinched nerve; not causing any urination issues; no longer taking Flexeril due to fatty liver; using icy hot spray but not much help  . Flank Pain    HPI Patient is in today with complaints of mid to low back pain x 2 to 3 weeks.  Has been using IcyHot spray and patches without much relief.  Describes the pain as sharp, worse with movement, that is a 8 out of 10.  Pain radiates to the right buttocks.  No lower extremity weakness.  Denies any injury or any previous history of any back issues.  Father is with him today and is concerned that he may have a liver problem.  Therefore, they would like to have his liver checked today.  Review of Systems  Constitutional: Negative.   Respiratory: Negative.    Cardiovascular: Negative.   Gastrointestinal: Negative.  Negative for heartburn, nausea and vomiting.  Musculoskeletal:  Positive for back pain.  Neurological: Negative.  Negative for weakness.  Psychiatric/Behavioral: Negative.    All other systems reviewed and are negative.  Past Medical History:  Diagnosis Date  . Thyroid  disease     Social History   Socioeconomic History  . Marital status: Single    Spouse name: Not on file  . Number of children: Not on file  . Years of education: Not on file  . Highest education level: Not on file  Occupational History  . Occupation: Event Organiser: [employer]    Comment: [employer]  Tobacco Use  . Smoking status: Never    Passive exposure: Never  . Smokeless tobacco: Never  Vaping Use  . Vaping status: Never Used  Substance and Sexual Activity  . Alcohol use: Yes    Alcohol/week: 0.0 standard drinks of alcohol  . Drug use: No  . Sexual activity: Not on file  Other Topics  Concern  . Not on file  Social History Narrative       Social Drivers of Corporate Investment Banker Strain: Not on file  Food Insecurity: Not on file  Transportation Needs: Not on file  Physical Activity: Not on file  Stress: Not on file  Social Connections: Not on file  Intimate Partner Violence: Not on file    History reviewed. No pertinent surgical history.  Family History  Problem Relation Age of Onset  . Diabetes Mother   . Hypertension Father     No Known Allergies  Current Outpatient Medications on File Prior to Visit  Medication Sig Dispense Refill  . FLUoxetine  (PROZAC ) 20 MG capsule Take 1 capsule (20 mg total) by mouth daily. 90 capsule 3  . levothyroxine  (SYNTHROID ) 175 MCG tablet TAKE 1 TABLET BY MOUTH ONCE DAILY ON AN EMPTY STOMACH 90 tablet 3  . cyclobenzaprine (FLEXERIL) 5 MG tablet Take 1 tablet (5 mg total) by mouth 2 (two) times daily as needed. (Patient not taking: Reported on 06/28/2024) 20 tablet 0   No current facility-administered medications on file prior to visit.    BP 116/78   Pulse 96   Temp 97.9 F (36.6 C)   Ht 5' 11 (1.803 m)   Wt 244 lb 9.6 oz (110.9 kg)  SpO2 98%   BMI 34.11 kg/m chart      Objective:    BP 116/78   Pulse 96   Temp 97.9 F (36.6 C)   Ht 5' 11 (1.803 m)   Wt 244 lb 9.6 oz (110.9 kg)   SpO2 98%   BMI 34.11 kg/m    Physical Exam Vitals and nursing note reviewed. Chaperone present: Father present.  Constitutional:      Appearance: Normal appearance. He is obese.  Cardiovascular:     Rate and Rhythm: Normal rate and regular rhythm.  Pulmonary:     Effort: Pulmonary effort is normal.     Breath sounds: Normal breath sounds.  Abdominal:     General: Abdomen is flat. Bowel sounds are normal.     Palpations: Abdomen is soft.  Musculoskeletal:        General: Tenderness present. No swelling, deformity or signs of injury.     Cervical back: Normal range of motion and neck supple.     Right lower leg:  No edema.     Left lower leg: No edema.     Comments: Tenderness to palpation of the right lower back extending to the right buttocks.  Negative straight leg raise.  No pain with rotation of the spine.  Skin:    General: Skin is warm and dry.  Neurological:     General: No focal deficit present.     Mental Status: He is alert and oriented to person, place, and time. Mental status is at baseline.  Psychiatric:        Mood and Affect: Mood normal.        Behavior: Behavior normal.        Thought Content: Thought content normal.    No results found for any visits on 06/28/24.      Assessment & Plan:   Problem List Items Addressed This Visit   None Visit Diagnoses       Lumbosacral radiculopathy    -  Primary   Relevant Orders   Comp Met (CMET)   DG Lumbar Spine Complete       Meds ordered this encounter  Medications  . methylPREDNISolone (MEDROL DOSEPAK) 4 MG TBPK tablet    Sig: As directed    Dispense:  21 tablet    Refill:  0   Call the office if symptoms worsen or persist.  Recheck as scheduled or sooner as needed. No follow-ups on file.  Haleemah Buckalew B Khushi Zupko, FNP

## 2024-06-28 NOTE — Telephone Encounter (Signed)
 [phone] is patient's contact number Sent to PCP for information only and sent to [facility] today for visit today Called [facility] and spoke with Kelly--who assisted patient in getting scheduled for a visit today Patient states right sided back pain x 5 days Patient was seen last month for symptoms on the right side of his abdomen and chest but today he is complaining of right sided back pain  FYI Only or Action Required?: FYI only for provider: appointment scheduled on 06/28/2024 at 11:20am at [facility] ---set up by [facility] at [facility]  Patient was last seen in primary care on 06/02/2024 by Wendee Lynwood HERO, NP.  Called Nurse Triage reporting Back Pain.  Symptoms began several days ago.  Interventions attempted: Nothing.  Symptoms are: gradually worsening.  Triage Disposition: See PCP When Office is Open (Within 3 Days)  Patient/caregiver understands and will follow disposition?: Yes--Patient wanted to be seen today and was okay with [facility] due to working in the same building--called [facility] and they scheduled patient for today                 Copied from CRM #[id number]. Topic: Clinical - Red Word Triage >> Jun 28, 2024 10:12 AM Alfonso ORN wrote: Red Word that prompted transfer to Nurse Triage: pt having back and side pain. Previous bloodwork done Reason for Disposition  [1] MODERATE back pain (e.g., interferes with normal activities) AND [2] present > 3 days  Answer Assessment - Initial Assessment Questions 1. ONSET: When did the pain begin? (e.g., minutes, hours, days)     5-6 days ago 2. LOCATION: Where does it hurt? (upper, mid or lower back)     Right side 3. SEVERITY: How bad is the pain?  (e.g., Scale 1-10; mild, moderate, or severe)     6-7 4. PATTERN: Is the pain constant? (e.g., yes, no; constant, intermittent)      Certain positions 5. RADIATION: Does the pain shoot into your legs or somewhere else?     Patient denies 6.  CAUSE:  What do you think is causing the back pain?      ------- 7. BACK OVERUSE:  Any recent lifting of heavy objects, strenuous work or exercise?     ------ 8. MEDICINES: What have you taken so far for the pain? (e.g., nothing, acetaminophen, NSAIDS)     nothing 9. NEUROLOGIC SYMPTOMS: Do you have any weakness, numbness, or problems with bowel/bladder control?     Patient denies 10. OTHER SYMPTOMS: Do you have any other symptoms? (e.g., fever, abdomen pain, burning with urination, blood in urine)       Chest pain  Protocols used: Back Pain-A-AH

## 2024-07-07 ENCOUNTER — Encounter: Payer: Self-pay | Admitting: Student in an Organized Health Care Education/Training Program

## 2024-07-07 ENCOUNTER — Ambulatory Visit: Admitting: Student in an Organized Health Care Education/Training Program

## 2024-07-07 VITALS — BP 117/78 | HR 83 | Wt 247.0 lb

## 2024-07-07 DIAGNOSIS — L219 Seborrheic dermatitis, unspecified: Secondary | ICD-10-CM | POA: Insufficient documentation

## 2024-07-07 DIAGNOSIS — Z114 Encounter for screening for human immunodeficiency virus [HIV]: Secondary | ICD-10-CM | POA: Diagnosis not present

## 2024-07-07 DIAGNOSIS — E039 Hypothyroidism, unspecified: Secondary | ICD-10-CM | POA: Diagnosis not present

## 2024-07-07 DIAGNOSIS — Z13220 Encounter for screening for lipoid disorders: Secondary | ICD-10-CM

## 2024-07-07 DIAGNOSIS — Z131 Encounter for screening for diabetes mellitus: Secondary | ICD-10-CM

## 2024-07-07 DIAGNOSIS — G4733 Obstructive sleep apnea (adult) (pediatric): Secondary | ICD-10-CM | POA: Diagnosis not present

## 2024-07-07 DIAGNOSIS — E66811 Obesity, class 1: Secondary | ICD-10-CM | POA: Insufficient documentation

## 2024-07-07 DIAGNOSIS — K760 Fatty (change of) liver, not elsewhere classified: Secondary | ICD-10-CM

## 2024-07-07 MED ORDER — KETOCONAZOLE 2 % EX SHAM
1.0000 | MEDICATED_SHAMPOO | CUTANEOUS | 0 refills | Status: AC
Start: 2024-07-07 — End: ?

## 2024-07-07 NOTE — Patient Instructions (Signed)
  VISIT SUMMARY: Today, you came in for a follow-up appointment to discuss your elevated ALT levels and other health concerns. We reviewed your history of hepatic steatosis, thyroid  issues, and sleep problems. We also discussed your current lifestyle and dietary habits.  YOUR PLAN: -HEPATIC STEATOSIS (FATTY LIVER DISEASE) AND OBESITY: Hepatic steatosis, or fatty liver disease, is often caused by obesity and can lead to liver damage. We have ordered blood work to check your liver function, cholesterol, and glucose levels. You should focus on weight management by eating more fruits, vegetables, whole grains, and lean proteins. Aim to lose 10 pounds over the next six months. Depending on your blood work results, we may consider a liver ultrasound. We will follow up in three months to monitor your progress.  -CONGENITAL HYPOTHYROIDISM: Congenital hypothyroidism is a condition where your thyroid  gland doesn't produce enough hormones. You are currently managing this with Synthroid . Continue taking your medication and schedule annual thyroid  function tests to ensure your dosage is correct.  -SEBORRHEIC DERMATITIS: Seborrheic dermatitis is a skin condition that causes flaking, often due to a fungal overgrowth. This is not related to your thyroid  issues. Use the prescribed antifungal shampoo twice a week on your hair and face to manage this condition.  -SUSPECTED SLEEP APNEA: Sleep apnea is a condition where your breathing stops and starts during sleep, often causing poor sleep quality and irritability. We have ordered a sleep study to evaluate this. If sleep apnea is confirmed, we may discuss CPAP therapy to help you sleep better.  INSTRUCTIONS: Please complete the blood work as soon as possible and schedule your sleep study. We will follow up in three months to review your blood work results and monitor your weight and liver function. Continue taking Synthroid  and use the antifungal shampoo as prescribed.  Schedule annual thyroid  function tests unless adjustments are needed.

## 2024-07-07 NOTE — Assessment & Plan Note (Signed)
 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 Regular monitoring of thyroid  function is required to ensure dosage accuracy. He should continue taking Synthroid  and schedule annual thyroid  function tests unless adjustments are needed.

## 2024-07-07 NOTE — Assessment & Plan Note (Addendum)
 A large neck circumference and snoring suggest sleep apnea, affecting sleep quality and causing irritability. A sleep study referral has been ordered for evaluation. Potential CPAP therapy was discussed if sleep apnea is confirmed.

## 2024-07-07 NOTE — Assessment & Plan Note (Signed)
 Facial flaking is likely due to fungal overgrowth and is unrelated to thyroid  issues. An antifungal shampoo has been prescribed for use twice weekly on hair and face.

## 2024-07-07 NOTE — Assessment & Plan Note (Signed)
 Hepatic steatosis is likely secondary to obesity, with elevated ALT levels. Weight management is essential to prevent liver disease progression. Blood work has been ordered to assess liver function, cholesterol, and glucose levels. Dietary changes have been advised, including increased intake of fruits, vegetables, whole grains, and lean proteins. A 10-pound weight loss over six months is encouraged. A liver ultrasound will be considered based on blood work results. A follow-up is scheduled in three months to monitor weight and liver function.  Last ultrasound in 2022 showed a possible mass in the liver, this was followed up with an MRI which did not show a mass but had some motion degrading.  He is doing well over the last 3 years, so very unlikely that it was a true pathologic mass.

## 2024-07-07 NOTE — Progress Notes (Signed)
 Established Patient Office Visit  Patient ID: Rien Marland, male    DOB: 1981/02/14  Age: 43 y.o. MRN: [id number] PCP: Jerrell Cleatus Ned, MD  Chief Complaint  Patient presents with   Establish Care    Subjective:     HPI  Discussed the use of AI scribe software for clinical note transcription with the patient, who gave verbal consent to proceed.  History of Present Illness Courtenay Budlong is a 43 year old male with hepatic steatosis who presents for follow-up of elevated ALT levels. He is accompanied by his father.  He has a history of hepatic steatosis diagnosed via ultrasound in 2022 and an MRI in December 2022 due to a question of a hepatic mass. He has experienced weight fluctuations over time and is currently taking an over-the-counter supplement for liver health, though he does not recall the name.  He has a history of thyroid  problems diagnosed at 3 old and is currently on Synthroid . His father mentioned that he had dry skin issues related to his thyroid  condition when he was younger.  He experiences snoring and congestion affecting his sleep, confirmed by his father. He occasionally takes melatonin gummies to aid sleep.  He works at [employer], sometimes up to seven days a week, and lives with his family. He enjoys Japanese food and tries to eat grilled chicken. He attempts to walk daily at [facility], though his father notes this is not consistent. He handles his finances with family assistance.  He has a history of irritability, especially when in pain, and his father notes he can become angry quickly. No current back pain.     Objective:     BP 117/78   Pulse 83   Wt 247 lb (112 kg)   SpO2 98%   BMI 34.45 kg/m   Physical Exam  Gen: Well-appearing man Skin: Face has areas of erythema and a tan waxy scale along the beard line and around his ears. Neck: Enlarged circumference at 19 inches.  Normal thyroid , no nodules or adenopathy Heart:  Regular, no murmur Lungs: Unlabored, clear throughout Abd: Obese, soft, no organomegaly, nontender Ext: Warm, no edema    Assessment & Plan:   Problem List Items Addressed This Visit       High   Hypothyroidism (Chronic)   Synthroid  is managing his congenital hypothyroidism. Regular monitoring of thyroid  function is required to ensure dosage accuracy. He should continue taking Synthroid  and schedule annual thyroid  function tests unless adjustments are needed.      Relevant Orders   TSH   Hepatic steatosis - Primary (Chronic)   Hepatic steatosis is likely secondary to obesity, with elevated ALT levels. Weight management is essential to prevent liver disease progression. Blood work has been ordered to assess liver function, cholesterol, and glucose levels. Dietary changes have been advised, including increased intake of fruits, vegetables, whole grains, and lean proteins. A 10-pound weight loss over six months is encouraged. A liver ultrasound will be considered based on blood work results. A follow-up is scheduled in three months to monitor weight and liver function.  Last ultrasound in 2022 showed a possible mass in the liver, this was followed up with an MRI which did not show a mass but had some motion degrading.  He is doing well over the last 3 years, so very unlikely that it was a true pathologic mass.      Relevant Orders   Comprehensive metabolic panel with GFR   CBC   Obesity,  Class I, BMI 30-34.9 (Chronic)     Medium    OSA (obstructive sleep apnea) (Chronic)   A large neck circumference and snoring suggest sleep apnea, affecting sleep quality and causing irritability. A sleep study referral has been ordered for evaluation. Potential CPAP therapy was discussed if sleep apnea is confirmed.      Relevant Orders   Ambulatory referral to Sleep Studies     Low   Seborrheic dermatitis (Chronic)   Facial flaking is likely due to fungal overgrowth and is unrelated to thyroid   issues. An antifungal shampoo has been prescribed for use twice weekly on hair and face.      Relevant Medications   ketoconazole  (NIZORAL ) 2 % shampoo   Other Visit Diagnoses       Screening for lipid disorders       Relevant Orders   Lipid panel     Screening for diabetes mellitus       Relevant Orders   Hemoglobin A1c     Screening for HIV (human immunodeficiency virus)       Relevant Orders   HIV Antibody (routine testing w rflx)       Return in about 3 months (around 10/07/2024).    Cleatus Debby Specking, MD [place] [facility]

## 2024-07-08 LAB — CBC
HCT: 45.7 % (ref 39.0–52.0)
Hemoglobin: 15.2 g/dL (ref 13.0–17.0)
MCHC: 33.4 g/dL (ref 30.0–36.0)
MCV: 85.3 fl (ref 78.0–100.0)
Platelets: 269 K/uL (ref 150.0–400.0)
RBC: 5.35 Mil/uL (ref 4.22–5.81)
RDW: 14.4 % (ref 11.5–15.5)
WBC: 6.2 K/uL (ref 4.0–10.5)

## 2024-07-08 LAB — COMPREHENSIVE METABOLIC PANEL WITH GFR
ALT: 64 U/L — ABNORMAL HIGH (ref 0–53)
AST: 33 U/L (ref 0–37)
Albumin: 4.8 g/dL (ref 3.5–5.2)
Alkaline Phosphatase: 81 U/L (ref 39–117)
BUN: 14 mg/dL (ref 6–23)
CO2: 29 meq/L (ref 19–32)
Calcium: 9.8 mg/dL (ref 8.4–10.5)
Chloride: 104 meq/L (ref 96–112)
Creatinine, Ser: 1.04 mg/dL (ref 0.40–1.50)
GFR: 88.91 mL/min (ref >60.00)
Glucose, Bld: 82 mg/dL (ref 70–99)
Potassium: 4.2 meq/L (ref 3.5–5.1)
Sodium: 141 meq/L (ref 135–145)
Total Bilirubin: 0.6 mg/dL (ref 0.2–1.2)
Total Protein: 7.7 g/dL (ref 6.0–8.3)

## 2024-07-08 LAB — LIPID PANEL
Cholesterol: 197 mg/dL (ref 0–200)
HDL: 41.9 mg/dL (ref >39.00)
LDL Cholesterol: 118 mg/dL — ABNORMAL HIGH (ref 0–99)
NonHDL: 154.7
Total CHOL/HDL Ratio: 5
Triglycerides: 186 mg/dL — ABNORMAL HIGH (ref 0.0–149.0)
VLDL: 37.2 mg/dL (ref 0.0–40.0)

## 2024-07-08 LAB — TSH: TSH: 29.74 u[IU]/mL — ABNORMAL HIGH (ref 0.35–5.50)

## 2024-07-08 LAB — HEMOGLOBIN A1C: Hgb A1c MFr Bld: 6.1 % (ref 4.6–6.5)

## 2024-07-08 LAB — HIV ANTIBODY (ROUTINE TESTING W REFLEX)
HIV 1&2 Ab, 4th Generation: NONREACTIVE
HIV FINAL INTERPRETATION: NEGATIVE

## 2024-07-11 ENCOUNTER — Ambulatory Visit: Payer: Self-pay | Admitting: Student in an Organized Health Care Education/Training Program

## 2024-07-11 DIAGNOSIS — E039 Hypothyroidism, unspecified: Secondary | ICD-10-CM

## 2024-07-12 MED ORDER — LEVOTHYROXINE SODIUM 200 MCG PO TABS: 200.0000 ug | ORAL_TABLET | Freq: Every day | ORAL | 1 refills | Status: AC

## 2024-07-12 NOTE — Progress Notes (Signed)
 Pt has reviewed labs on MyChart Lvm to call office Will send unable to reach letter out.

## 2024-07-12 NOTE — Progress Notes (Signed)
Called patient and left vm to return call.

## 2024-08-04 ENCOUNTER — Encounter

## 2024-10-06 ENCOUNTER — Encounter: Admitting: Student in an Organized Health Care Education/Training Program
